# Patient Record
Sex: Female | Born: 1975 | Race: Black or African American | Hispanic: No | Marital: Married | Smoking: Never smoker
Health system: Southern US, Community
[De-identification: ages and names within clinical notes are randomized; demographics above are authoritative.]

## PROBLEM LIST (undated history)

## (undated) DIAGNOSIS — R002 Palpitations: Secondary | ICD-10-CM

---

## 2005-10-02 ENCOUNTER — Emergency Department (HOSPITAL_COMMUNITY): Admission: EM | Admit: 2005-10-02 | Discharge: 2005-10-03 | Payer: Self-pay | Admitting: Emergency Medicine

## 2005-10-12 ENCOUNTER — Inpatient Hospital Stay (HOSPITAL_COMMUNITY): Admission: AD | Admit: 2005-10-12 | Discharge: 2005-10-12 | Payer: Self-pay | Admitting: Obstetrics and Gynecology

## 2005-11-13 ENCOUNTER — Inpatient Hospital Stay (HOSPITAL_COMMUNITY): Admission: AD | Admit: 2005-11-13 | Discharge: 2005-11-13 | Payer: Self-pay | Admitting: Gynecology

## 2006-05-12 ENCOUNTER — Encounter (INDEPENDENT_AMBULATORY_CARE_PROVIDER_SITE_OTHER): Payer: Self-pay | Admitting: Specialist

## 2006-05-12 ENCOUNTER — Inpatient Hospital Stay (HOSPITAL_COMMUNITY): Admission: RE | Admit: 2006-05-12 | Discharge: 2006-05-15 | Payer: Self-pay | Admitting: Obstetrics

## 2008-08-28 ENCOUNTER — Inpatient Hospital Stay (HOSPITAL_COMMUNITY): Admission: AD | Admit: 2008-08-28 | Discharge: 2008-08-28 | Payer: Self-pay | Admitting: Obstetrics & Gynecology

## 2008-08-28 ENCOUNTER — Ambulatory Visit: Payer: Self-pay | Admitting: Obstetrics and Gynecology

## 2008-08-30 ENCOUNTER — Inpatient Hospital Stay (HOSPITAL_COMMUNITY): Admission: AD | Admit: 2008-08-30 | Discharge: 2008-08-30 | Payer: Self-pay | Admitting: Obstetrics & Gynecology

## 2008-09-04 ENCOUNTER — Inpatient Hospital Stay (HOSPITAL_COMMUNITY): Admission: AD | Admit: 2008-09-04 | Discharge: 2008-09-04 | Payer: Self-pay | Admitting: Obstetrics & Gynecology

## 2008-10-09 ENCOUNTER — Inpatient Hospital Stay (HOSPITAL_COMMUNITY): Admission: AD | Admit: 2008-10-09 | Discharge: 2008-10-09 | Payer: Self-pay | Admitting: Obstetrics & Gynecology

## 2008-10-09 ENCOUNTER — Ambulatory Visit: Payer: Self-pay | Admitting: Obstetrics and Gynecology

## 2009-01-09 ENCOUNTER — Inpatient Hospital Stay (HOSPITAL_COMMUNITY): Admission: AD | Admit: 2009-01-09 | Discharge: 2009-01-09 | Payer: Self-pay | Admitting: Obstetrics

## 2009-04-08 ENCOUNTER — Inpatient Hospital Stay (HOSPITAL_COMMUNITY): Admission: RE | Admit: 2009-04-08 | Discharge: 2009-04-11 | Payer: Self-pay | Admitting: Obstetrics

## 2009-06-23 ENCOUNTER — Emergency Department (HOSPITAL_COMMUNITY): Admission: EM | Admit: 2009-06-23 | Discharge: 2009-06-23 | Payer: Self-pay | Admitting: Emergency Medicine

## 2010-07-04 ENCOUNTER — Emergency Department (HOSPITAL_COMMUNITY)
Admission: EM | Admit: 2010-07-04 | Discharge: 2010-07-04 | Disposition: A | Discharge status: Home / Self Care | Payer: Self-pay | Attending: Emergency Medicine | Admitting: Emergency Medicine

## 2010-07-04 DIAGNOSIS — N61 Mastitis without abscess: Secondary | ICD-10-CM | POA: Insufficient documentation

## 2010-07-04 DIAGNOSIS — R5381 Other malaise: Secondary | ICD-10-CM | POA: Insufficient documentation

## 2010-07-04 DIAGNOSIS — N63 Unspecified lump in unspecified breast: Secondary | ICD-10-CM | POA: Insufficient documentation

## 2010-07-04 DIAGNOSIS — N644 Mastodynia: Secondary | ICD-10-CM | POA: Insufficient documentation

## 2010-07-04 DIAGNOSIS — R42 Dizziness and giddiness: Secondary | ICD-10-CM | POA: Insufficient documentation

## 2010-07-06 ENCOUNTER — Inpatient Hospital Stay (HOSPITAL_COMMUNITY)
Admission: AD | Admit: 2010-07-06 | Discharge: 2010-07-06 | Disposition: A | Discharge status: Home / Self Care | Payer: Self-pay | Source: Ambulatory Visit | Attending: Obstetrics and Gynecology | Admitting: Obstetrics and Gynecology

## 2010-07-06 DIAGNOSIS — N61 Mastitis without abscess: Secondary | ICD-10-CM | POA: Insufficient documentation

## 2010-08-26 LAB — CBC
Hemoglobin: 11.7 g/dL — ABNORMAL LOW (ref 12.0–15.0)
Hemoglobin: 9.6 g/dL — ABNORMAL LOW (ref 12.0–15.0)
MCHC: 32.7 g/dL (ref 30.0–36.0)
MCV: 85.1 fL (ref 78.0–100.0)
Platelets: 150 10*3/uL (ref 150–400)
RBC: 3.42 MIL/uL — ABNORMAL LOW (ref 3.87–5.11)
RDW: 16 % — ABNORMAL HIGH (ref 11.5–15.5)

## 2010-08-26 LAB — RPR: RPR Ser Ql: NONREACTIVE

## 2010-08-29 LAB — URINALYSIS, ROUTINE W REFLEX MICROSCOPIC
Bilirubin Urine: NEGATIVE
Ketones, ur: NEGATIVE mg/dL
Nitrite: NEGATIVE
pH: 6 (ref 5.0–8.0)

## 2010-08-29 LAB — URINE MICROSCOPIC-ADD ON

## 2010-09-01 LAB — URINE MICROSCOPIC-ADD ON

## 2010-09-01 LAB — URINALYSIS, ROUTINE W REFLEX MICROSCOPIC
Glucose, UA: NEGATIVE mg/dL
Ketones, ur: NEGATIVE mg/dL
Protein, ur: NEGATIVE mg/dL

## 2010-10-09 NOTE — Discharge Summary (Signed)
NAMETRYSTAN, EADS NO.:  [id number]   MEDICAL RECORD NO.:  [id number]          PATIENT TYPE:  INP   LOCATION:  9118                          FACILITY:  [facility]   PHYSICIAN:  Kathreen Cosier, M.D.DATE OF BIRTH:  1976/04/30   DATE OF ADMISSION:  05/12/2006  DATE OF DISCHARGE:  05/15/2006                               DISCHARGE SUMMARY   HOSPITAL COURSE:  The patient is a 34 year old gravida 2, para 1-0-0-1  who had a previous Cesarean section with her other baby.  She is now at  term with an [facility] of May 19, 2006 and desired a repeat Cesarean  section.  She underwent a repeat low transverse Cesarean section.  She  had a female with Apgar's of 9 and 9, weighing 7 pounds, 12 ounces.  Fluid was clear.  Postoperatively she did well.  Her hemoglobin was  10.5.  She was discharged home on the third postoperative day ambulatory  and on a regular diet.  She is discharged on Tylox for pain and ferrous  sulfate for anemia.  She is to see me in six weeks.   DISCHARGE DIAGNOSES:  Status post repeat low transverse Cesarean section  at term.           ______________________________  Kathreen Cosier, M.D.     BAM/MEDQ  D:  06/08/2006  T:  06/08/2006  Job:  [id number]

## 2010-10-09 NOTE — Op Note (Signed)
NAMECELISE, BAZAR NO.:  [id number]   MEDICAL RECORD NO.:  [id number]          PATIENT TYPE:  INP   LOCATION:  9199                          FACILITY:  [facility]   PHYSICIAN:  Kathreen Cosier, M.D.DATE OF BIRTH:  [birth year]   DATE OF PROCEDURE:  05/12/2006  DATE OF DISCHARGE:                               OPERATIVE REPORT   PREOPERATIVE DIAGNOSIS:  Previous cesarean section at term, desires  repeat.   SURGEON:  Kathreen Cosier, M.D.   ANESTHESIA:  Spinal.   DESCRIPTION OF PROCEDURE:  Patient placed on the operating table in the  supine position after spinal had been administered.  Abdomen prepped and  draped. Bladder emptied with Foley catheter.  A suprapubic incision made  through the old scar, carried down to the rectus fascia.  Fascia cleaned  and incised the length of the incision.  Rectus muscles retracted  laterally. Peritoneum incised longitudinally.  Transverse incision made  in the visceral peritoneum above the bladder.  Bladder mobilized  inferiorly.  Transverse lower uterine incision made.  Fluid clear.  Patient delivered from the LOA position of a female, Apgars 9 and 9,  weighing 7 pounds 12 ounces. Team was in attendance.  The placenta was  posterior and removed manually.  Uterine cavity cleaned with dry laps.  Uterine incision closed in one layer with continuous suture of #1  chromic.  Hemostasis satisfactory.  Bladder flap was reattached with 2-0  chromic.  Uterus well contracted, tubes and ovaries normal.  Abdomen  closed in layers, peritoneum with continuous suture of 0 chromic, fascia  with continuous suture of 0 Dexon and skin closed with subcuticular  stitch of 4-0 Monocryl.   BLOOD LOSS:  600 mL.   Patient tolerated the procedure well.           ______________________________  Kathreen Cosier, M.D.     BAM/MEDQ  D:  05/12/2006  T:  05/12/2006  Job:  [id number]

## 2011-03-01 ENCOUNTER — Emergency Department (HOSPITAL_COMMUNITY)
Admission: EM | Admit: 2011-03-01 | Discharge: 2011-03-01 | Disposition: A | Discharge status: Home / Self Care | Payer: Self-pay | Attending: Emergency Medicine | Admitting: Emergency Medicine

## 2011-03-01 ENCOUNTER — Emergency Department (HOSPITAL_COMMUNITY): Payer: Self-pay

## 2011-03-01 DIAGNOSIS — M79609 Pain in unspecified limb: Secondary | ICD-10-CM | POA: Insufficient documentation

## 2011-03-01 DIAGNOSIS — M722 Plantar fascial fibromatosis: Secondary | ICD-10-CM | POA: Insufficient documentation

## 2012-09-05 ENCOUNTER — Emergency Department (HOSPITAL_COMMUNITY)
Admission: EM | Admit: 2012-09-05 | Discharge: 2012-09-05 | Disposition: A | Discharge status: Home / Self Care | Payer: Self-pay | Attending: Emergency Medicine | Admitting: Emergency Medicine

## 2012-09-05 ENCOUNTER — Encounter (HOSPITAL_COMMUNITY): Payer: Self-pay | Admitting: *Deleted

## 2012-09-05 DIAGNOSIS — J029 Acute pharyngitis, unspecified: Secondary | ICD-10-CM | POA: Insufficient documentation

## 2012-09-05 DIAGNOSIS — J309 Allergic rhinitis, unspecified: Secondary | ICD-10-CM

## 2012-09-05 DIAGNOSIS — J069 Acute upper respiratory infection, unspecified: Secondary | ICD-10-CM | POA: Insufficient documentation

## 2012-09-05 DIAGNOSIS — R0982 Postnasal drip: Secondary | ICD-10-CM | POA: Insufficient documentation

## 2012-09-05 DIAGNOSIS — J302 Other seasonal allergic rhinitis: Secondary | ICD-10-CM

## 2012-09-05 DIAGNOSIS — J3489 Other specified disorders of nose and nasal sinuses: Secondary | ICD-10-CM | POA: Insufficient documentation

## 2012-09-05 DIAGNOSIS — H5789 Other specified disorders of eye and adnexa: Secondary | ICD-10-CM | POA: Insufficient documentation

## 2012-09-05 DIAGNOSIS — R6889 Other general symptoms and signs: Secondary | ICD-10-CM | POA: Insufficient documentation

## 2012-09-05 MED ORDER — LORATADINE 10 MG PO TABS: 10.0000 mg | ORAL_TABLET | Freq: Every day | ORAL | Status: DC

## 2012-09-05 MED ORDER — FLUTICASONE PROPIONATE 50 MCG/ACT NA SUSP: 2.0000 | Freq: Every day | NASAL | Status: DC

## 2012-09-05 NOTE — ED Provider Notes (Signed)
History     CSN: [id number]  Arrival date & time 09/05/12  1644   First MD Initiated Contact with Patient 09/05/12 1822      Chief Complaint  Patient presents with  . URI    (Consider location/radiation/quality/duration/timing/severity/associated sxs/prior treatment) HPI Comments: 37 year old female with no significant past medical history presents to the emergency department complaining of itchy eyes, itchy throat and runny nose x2 weeks. Patient states she has seasonal allergies and feels like they're acting up. She has tried taking his meds without relief. Despite triage summary, patient denies cough. Rhinorrhea is clear. Denies fever, chills, shortness of breath, nausea or vomiting. Her daughter has similar symptoms. Patient states she has been outside more taking her kids to and from places.  Patient is a 37 y.o. female presenting with URI. The history is provided by the patient.  URI Presenting symptoms: rhinorrhea and sore throat   Presenting symptoms: no cough and no fever   Associated symptoms: no headaches     History reviewed. No pertinent past medical history.  Past Surgical History  Procedure Laterality Date  . Cesarean section      No family history on file.  History  Substance Use Topics  . Smoking status: Never Smoker   . Smokeless tobacco: Not on file  . Alcohol Use: No    OB History   Grav Para Term Preterm Abortions TAB SAB Ect Mult Living                  Review of Systems  Constitutional: Negative for fever and chills.  HENT: Positive for sore throat and rhinorrhea.   Eyes: Positive for discharge (clear, watery) and itching. Negative for pain and redness.  Respiratory: Negative for cough and shortness of breath.   Cardiovascular: Negative for chest pain.  Skin: Negative for rash.  Neurological: Negative for headaches.  All other systems reviewed and are negative.    Allergies  Review of patient's allergies indicates no known  allergies.  Home Medications   Current Outpatient Rx  Name  Route  Sig  Dispense  Refill  . fluticasone (FLONASE) 50 MCG/ACT nasal spray   Nasal   Place 2 sprays into the nose daily.   16 g   2   . loratadine (CLARITIN) 10 MG tablet   Oral   Take 1 tablet (10 mg total) by mouth daily.   5 tablet   0     BP 124/81  Pulse 90  Temp(Src) 98.2 F (36.8 C) (Oral)  Resp 16  SpO2 98%  LMP 08/21/2012  Physical Exam  Nursing note and vitals reviewed. Constitutional: She is oriented to person, place, and time. She appears well-developed and well-nourished. No distress.  HENT:  Head: Normocephalic and atraumatic.  Right Ear: Tympanic membrane and ear canal normal.  Nose: Mucosal edema present. No rhinorrhea. Right sinus exhibits no maxillary sinus tenderness and no frontal sinus tenderness. Left sinus exhibits no maxillary sinus tenderness and no frontal sinus tenderness.  Mouth/Throat: Uvula is midline and mucous membranes are normal. Posterior oropharyngeal erythema present. No oropharyngeal exudate or posterior oropharyngeal edema.  Clear post-nasal drip present.  Eyes: Conjunctivae and EOM are normal. Pupils are equal, round, and reactive to light. Right eye exhibits no discharge. Left eye exhibits no discharge.  Neck: Normal range of motion. Neck supple.  Cardiovascular: Normal rate, regular rhythm, normal heart sounds and intact distal pulses.   Pulmonary/Chest: Effort normal and breath sounds normal. No respiratory distress. She has no  wheezes. She has no rales.  Musculoskeletal: Normal range of motion. She exhibits no edema.  Lymphadenopathy:    She has no cervical adenopathy.  Neurological: She is alert and oriented to person, place, and time.  Skin: Skin is warm and dry. No rash noted. She is not diaphoretic.  Psychiatric: She has a normal mood and affect. Her behavior is normal.    ED Course  Procedures (including critical care time)  Labs Reviewed - No data to  display No results found.   1. Seasonal allergies   2. Allergic rhinitis       MDM  37 y/o female with seasonal allergies. Mucosal edema with clear post nasal drip present on exam. Vitals stable, afebrile, NAD. Rx flonase for mucosal edema and claritin daily. Advised OTC eye drops. Resource guide given for PCP follow up.        Trevor Mace, PA-C 09/05/12 [phone]

## 2012-09-05 NOTE — ED Notes (Signed)
Pt states that she is not having pain but her throat, nose, eyes, and ears itch.

## 2012-09-05 NOTE — ED Notes (Signed)
Pt states cough, runny nose, sore throat and itchy eyes x 2 weeks.  She states every spring she has the same problem.  She took mucinex thinking it would help and it hasn't, that 's why she came today.  Her daughter is with her and she would like to take her to peds.

## 2012-09-07 NOTE — ED Provider Notes (Signed)
Medical screening examination/treatment/procedure(s) were performed by non-physician practitioner and as supervising physician I was immediately available for consultation/collaboration.   Saajan Willmon M Erich Kochan, DO 09/07/12 1444 

## 2012-09-16 IMAGING — CR DG FOOT COMPLETE 3+V*L*
3 series · 3 of 3 positions shown · non-contrast
Comparison: None.

CLINICAL DATA: Left foot pain.

LEFT FOOT - COMPLETE 3+ VIEW

[x foot ap left]
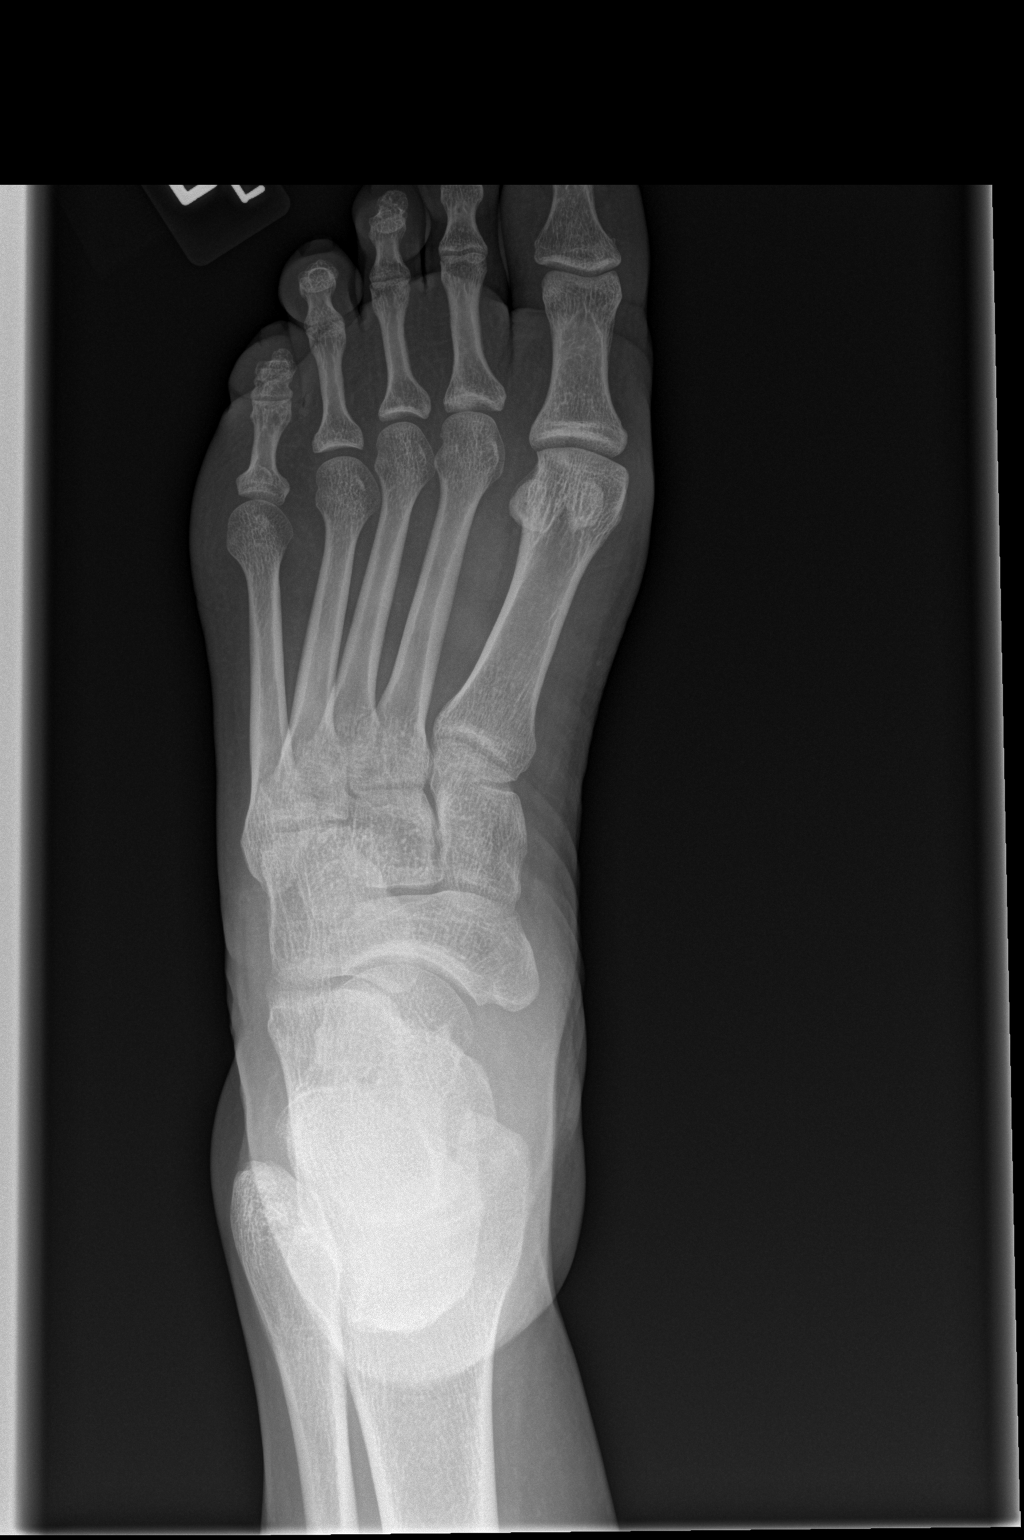

[x foot obl left]
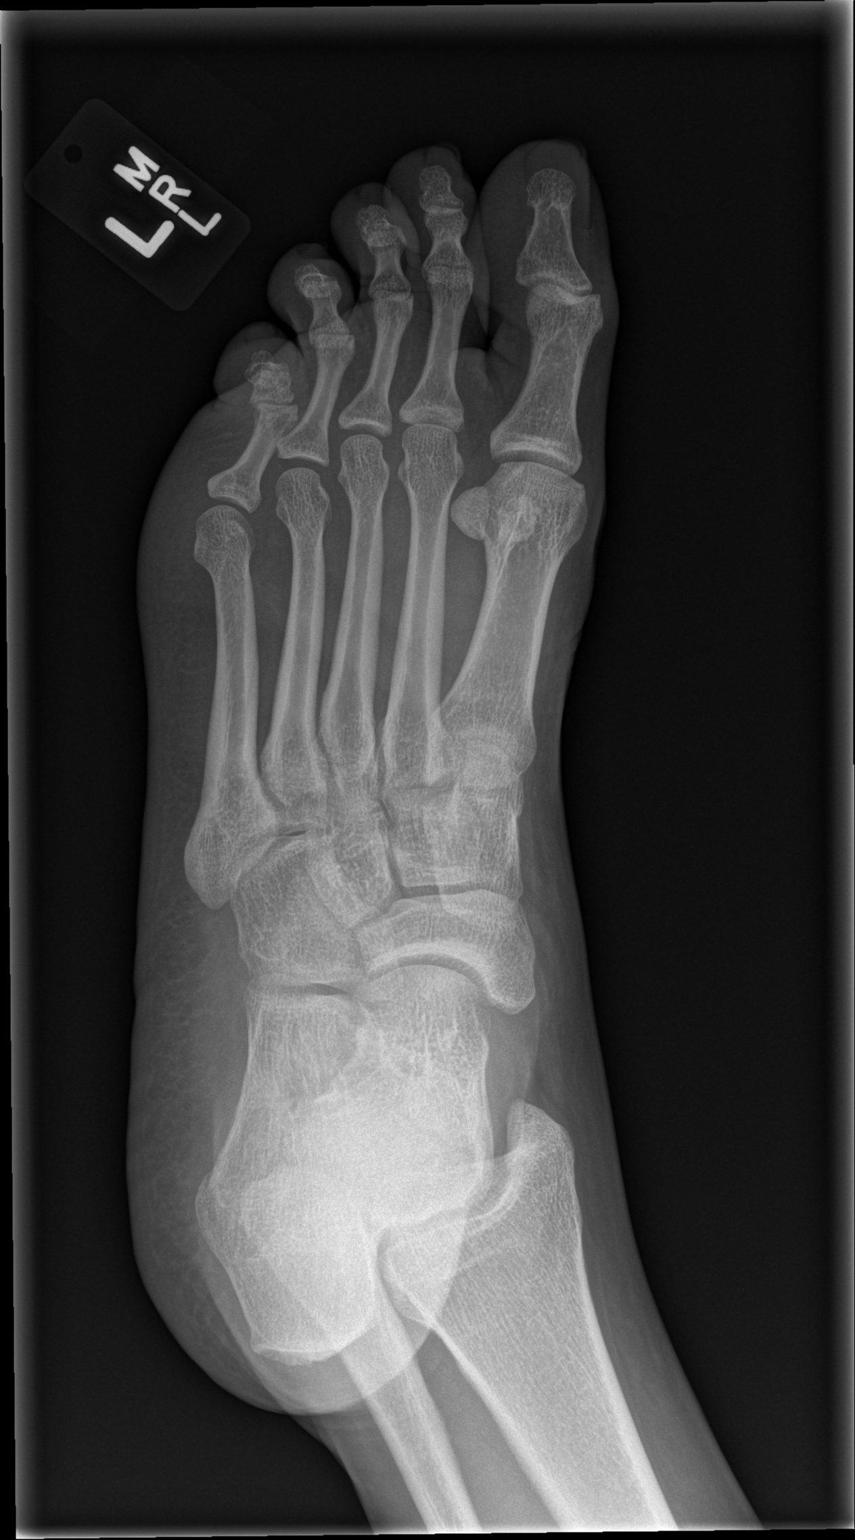

[x foot lat left]
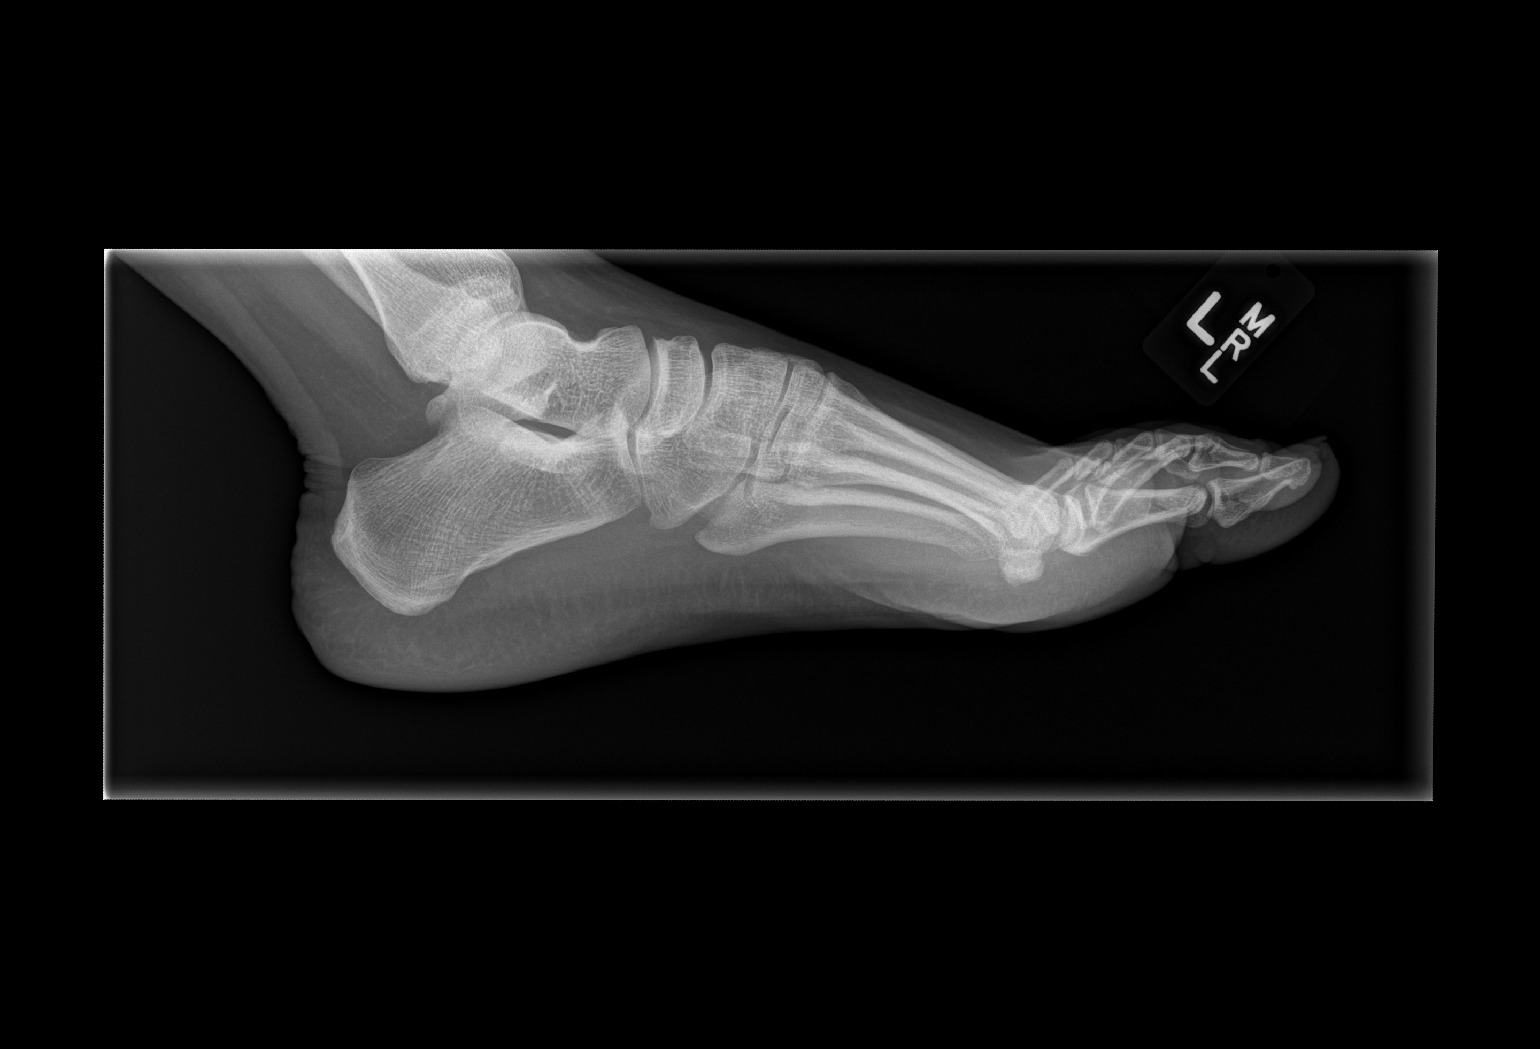

[3 of 3 positions shown; findings below may reference images not displayed]

FINDINGS: There is no evidence of fracture or dislocation.  There
is no evidence of arthropathy or other focal bone abnormality.
Soft tissues are unremarkable.
IMPRESSION: Negative.

## 2013-06-22 ENCOUNTER — Encounter (HOSPITAL_COMMUNITY): Payer: Self-pay | Admitting: *Deleted

## 2013-06-22 ENCOUNTER — Inpatient Hospital Stay (HOSPITAL_COMMUNITY)
Admission: AD | Admit: 2013-06-22 | Discharge: 2013-06-22 | Disposition: A | Discharge status: Home / Self Care | Payer: 59 | Source: Ambulatory Visit | Attending: Obstetrics & Gynecology | Admitting: Obstetrics & Gynecology

## 2013-06-22 DIAGNOSIS — R21 Rash and other nonspecific skin eruption: Secondary | ICD-10-CM | POA: Insufficient documentation

## 2013-06-22 DIAGNOSIS — R350 Frequency of micturition: Secondary | ICD-10-CM | POA: Insufficient documentation

## 2013-06-22 DIAGNOSIS — R300 Dysuria: Secondary | ICD-10-CM | POA: Insufficient documentation

## 2013-06-22 DIAGNOSIS — B372 Candidiasis of skin and nail: Secondary | ICD-10-CM

## 2013-06-22 HISTORY — DX: Palpitations: R00.2

## 2013-06-22 LAB — URINE MICROSCOPIC-ADD ON

## 2013-06-22 LAB — URINALYSIS, ROUTINE W REFLEX MICROSCOPIC
BILIRUBIN URINE: NEGATIVE
GLUCOSE, UA: NEGATIVE mg/dL
KETONES UR: NEGATIVE mg/dL
Nitrite: NEGATIVE
PH: 5 (ref 5.0–8.0)
PROTEIN: NEGATIVE mg/dL
Specific Gravity, Urine: >1.03 — ABNORMAL HIGH (ref 1.005–1.030)
Urobilinogen, UA: 0.2 mg/dL (ref 0.0–1.0)

## 2013-06-22 MED ORDER — FLUCONAZOLE 150 MG PO TABS
150.0000 mg | ORAL_TABLET | Freq: Once | ORAL | Status: AC
  Administered 2013-06-22: 150 mg via ORAL
  Filled 2013-06-22: qty 1

## 2013-06-22 MED ORDER — CLOTRIMAZOLE 1 % EX CREA
TOPICAL_CREAM | CUTANEOUS | Status: AC
Start: 2013-06-22 — End: ?

## 2013-06-22 NOTE — Discharge Instructions (Signed)
Candida Infection, Adult A candida infection (also called yeast, fungus and Monilia infection) is an overgrowth of yeast that can occur anywhere on the body. A yeast infection commonly occurs in warm, moist body areas. Usually, the infection remains localized but can spread to become a systemic infection. A yeast infection may be a sign of a more severe disease such as diabetes, leukemia, or AIDS. A yeast infection can occur in both men and women. In women, Candida vaginitis is a vaginal infection. It is one of the most common causes of vaginitis. Men usually do not have symptoms or know they have an infection until other problems develop. Men may find out they have a yeast infection because their sex partner has a yeast infection. Uncircumcised men are more likely to get a yeast infection than circumcised men. This is because the uncircumcised glans is not exposed to air and does not remain as dry as that of a circumcised glans. Older adults may develop yeast infections around dentures. CAUSES  Women  Antibiotics.  Steroid medication taken for a long time.  Being overweight (obese).  Diabetes.  Poor immune condition.  Certain serious medical conditions.  Immune suppressive medications for organ transplant patients.  Chemotherapy.  Pregnancy.  Menstration.  Stress and fatigue.  Intravenous drug use.  Oral contraceptives.  Wearing tight-fitting clothes in the crotch area.  Catching it from a sex partner who has a yeast infection.  Spermicide.  Intravenous, urinary, or other catheters. Men  Catching it from a sex partner who has a yeast infection.  Having oral or anal sex with a person who has the infection.  Spermicide.  Diabetes.  Antibiotics.  Poor immune system.  Medications that suppress the immune system.  Intravenous drug use.  Intravenous, urinary, or other catheters. SYMPTOMS  Women  Thick, white vaginal discharge.  Vaginal itching.  Redness and  swelling in and around the vagina.  Irritation of the lips of the vagina and perineum.  Blisters on the vaginal lips and perineum.  Painful sexual intercourse.  Low blood sugar (hypoglycemia).  Painful urination.  Bladder infections.  Intestinal problems such as constipation, indigestion, bad breath, bloating, increase in gas, diarrhea, or loose stools. Men  Men may develop intestinal problems such as constipation, indigestion, bad breath, bloating, increase in gas, diarrhea, or loose stools.  Dry, cracked skin on the penis with itching or discomfort.  Jock itch.  Dry, flaky skin.  Athlete's foot.  Hypoglycemia. DIAGNOSIS  Women  A history and an exam are performed.  The discharge may be examined under a microscope.  A culture may be taken of the discharge. Men  A history and an exam are performed.  Any discharge from the penis or areas of cracked skin will be looked at under the microscope and cultured.  Stool samples may be cultured. TREATMENT  Women  Vaginal antifungal suppositories and creams.  Medicated creams to decrease irritation and itching on the outside of the vagina.  Warm compresses to the perineal area to decrease swelling and discomfort.  Oral antifungal medications.  Medicated vaginal suppositories or cream for repeated or recurrent infections.  Wash and dry the irritation areas before applying the cream.  Eating yogurt with lactobacillus may help with prevention and treatment.  Sometimes painting the vagina with gentian violet solution may help if creams and suppositories do not work. Men  Antifungal creams and oral antifungal medications.  Sometimes treatment must continue for 30 days after the symptoms go away to prevent recurrence. HOME CARE   INSTRUCTIONS  Women  Use cotton underwear and avoid tight-fitting clothing.  Avoid colored, scented toilet paper and deodorant tampons or pads.  Do not douche.  Keep your diabetes  under control.  Finish all the prescribed medications.  Keep your skin clean and dry.  Consume milk or yogurt with lactobacillus active culture regularly. If you get frequent yeast infections and think that is what the infection is, there are over-the-counter medications that you can get. If the infection does not show healing in 3 days, talk to your caregiver.  Tell your sex partner you have a yeast infection. Your partner may need treatment also, especially if your infection does not clear up or recurs. Men  Keep your skin clean and dry.  Keep your diabetes under control.  Finish all prescribed medications.  Tell your sex partner that you have a yeast infection so they can be treated if necessary. SEEK MEDICAL CARE IF:   Your symptoms do not clear up or worsen in one week after treatment.  You have an oral temperature above 102 F (38.9 C).  You have trouble swallowing or eating for a prolonged time.  You develop blisters on and around your vagina.  You develop vaginal bleeding and it is not your menstrual period.  You develop abdominal pain.  You develop intestinal problems as mentioned above.  You get weak or lightheaded.  You have painful or increased urination.  You have pain during sexual intercourse. MAKE SURE YOU:   Understand these instructions.  Will watch your condition.  Will get help right away if you are not doing well or get worse. Document Released: 06/17/2004 Document Revised: 08/02/2011 Document Reviewed: 09/29/2009 [employer] Patient Information 2014 [employer].  

## 2013-06-22 NOTE — MAU Note (Signed)
C/o rash on both breast for past 4 days; c/o intermittent R breast lump( that is not present now); c/o recurrent boils on vagina for past year; has a boil present but it has "popped";

## 2013-06-22 NOTE — MAU Provider Note (Signed)
History     CSN: [id number]  Arrival date and time: 06/22/13 1020   None     Chief Complaint  Patient presents with  . Rash  . Breast Mass  . Recurrent Skin Infections   HPI Anna Saunders is 38 y.o. [id number] presents with a pruitic rash on her breasts and feels like something is in the right breast.  The "fullness" has been in the right breast for over a year.  Rash began 4 days ago.  She also had bumps that pop-clear fluid on her vaginal area that have now resolved.  She also has urinary frequency and dysuria.  She denies fever and chills.  LMP was last week.  Is not using anything for contraception.  She would like a pregnancy.  Dr. Gaynell Saunders is her MD    Past Medical History  Diagnosis Date  . Heart palpitations     Past Surgical History  Procedure Laterality Date  . Cesarean section      Family History  Problem Relation Age of Onset  . Hypertension Mother   . Diabetes Father   . Hypertension Maternal Grandmother   . Diabetes Paternal Grandfather     History  Substance Use Topics  . Smoking status: Never Smoker   . Smokeless tobacco: Not on file  . Alcohol Use: No    Allergies: No Known Allergies  Prescriptions prior to admission  Medication Sig Dispense Refill  . Cholecalciferol (VITAMIN D PO) Take 1 tablet by mouth daily.      . naproxen sodium (ANAPROX) 220 MG tablet Take 220 mg by mouth daily as needed (for pain).        Review of Systems  Constitutional: Negative for fever and chills.  Gastrointestinal: Negative for nausea, vomiting and abdominal pain.  Genitourinary: Negative for dysuria and frequency.       BREAST--rash X 4 days on her breast.  1 yr of "feeling something" in her right breast.  Neurological: Negative for headaches.   Physical Exam   Blood pressure 120/76, pulse 68, temperature 98.9 F (37.2 C), temperature source Oral, resp. rate 18, weight 240 lb (108.863 kg).  Physical Exam  Constitutional: She is oriented to person,  place, and time. She appears well-developed and well-nourished. No distress.  HENT:  Head: Normocephalic.  Neck: Normal range of motion.  Cardiovascular: Normal rate.   Respiratory: Effort normal. Right breast exhibits skin change (Several areas of dry, rough circular patches without redness, tenderness, blistering or oozing.  Several on each breast.  Breast exam is negative for dominant masses or nipple discharge bialterally.  Mild FCB changes in the UOQ. ). Right breast exhibits no inverted nipple, no mass and no tenderness. Left breast exhibits skin change. Left breast exhibits no inverted nipple, no mass, no nipple discharge and no tenderness. Breasts are symmetrical.  GI: Soft. She exhibits no distension and no mass. There is no tenderness. There is no rebound and no guarding.  Genitourinary: There is no rash, tenderness or lesion on the right labia. There is no rash, tenderness or lesion on the left labia. Uterus is not enlarged and not tender. Cervix exhibits no motion tenderness, no discharge and no friability. Right adnexum displays no mass, no tenderness and no fullness. Left adnexum displays no mass, no tenderness and no fullness. There is bleeding (small amount of menstrual bleeding) around the vagina. No erythema or tenderness around the vagina. No foreign body around the vagina. No signs of injury around the vagina. No vaginal  discharge found.  Neurological: She is alert and oriented to person, place, and time.  Skin: Skin is warm and dry.  Psychiatric: She has a normal mood and affect. Her behavior is normal.   Results for orders placed during the hospital encounter of 06/22/13 (from the past 24 hour(s))  URINALYSIS, ROUTINE W REFLEX MICROSCOPIC     Status: Abnormal   Collection Time    06/22/13  2:35 PM      Result Value Range   Color, Urine YELLOW  YELLOW   APPearance CLEAR  CLEAR   Specific Gravity, Urine >1.030 (*) 1.005 - 1.030   pH 5.0  5.0 - 8.0   Glucose, UA NEGATIVE   NEGATIVE mg/dL   Hgb urine dipstick LARGE (*) NEGATIVE   Bilirubin Urine NEGATIVE  NEGATIVE   Ketones, ur NEGATIVE  NEGATIVE mg/dL   Protein, ur NEGATIVE  NEGATIVE mg/dL   Urobilinogen, UA 0.2  0.0 - 1.0 mg/dL   Nitrite NEGATIVE  NEGATIVE   Leukocytes, UA TRACE (*) NEGATIVE  URINE MICROSCOPIC-ADD ON     Status: Abnormal   Collection Time    06/22/13  2:35 PM      Result Value Range   Squamous Epithelial / LPF FEW (*) RARE   WBC, UA 3-6  <3 WBC/hpf   RBC / HPF 3-6  <3 RBC/hpf   Bacteria, UA FEW (*) RARE      MAU Course  Procedures DIflucan 150mg  po given in MAU MDM Discussed labs and physical findings with the patient.   She will follow up with Dr. Gaynell FaceMarshall is sxs worsen  Assessment and Plan  A:  Rash on breasts-appears to be fungal      Negative UA  P:  Antifungal cream to Pharmacy       Follow up with Dr. Gaynell FaceMarshall if sxs persist for re-evaluation      Screening mammogram at age 38.    Anna Saunders,EVE M 06/22/2013, 1:33 PM

## 2013-06-24 LAB — URINE CULTURE: Colony Count: >=100000

## 2013-06-25 ENCOUNTER — Other Ambulatory Visit: Payer: Self-pay | Admitting: Obstetrics & Gynecology

## 2013-06-25 MED ORDER — CIPROFLOXACIN HCL 500 MG PO TABS: 500.0000 mg | ORAL_TABLET | Freq: Two times a day (BID) | ORAL | Status: AC

## 2013-06-25 NOTE — Progress Notes (Signed)
E coli UTI sensitive to cipro.  Rx called in.  Rn to call pt.

## 2013-06-26 NOTE — Progress Notes (Signed)
Called pt and informed her of +UTI requiring antibiotic treatment. She was advised that her Rx has been sent to her pharmacy and should be ready for pick up.  Pt voiced understanding.

## 2014-03-25 ENCOUNTER — Encounter (HOSPITAL_COMMUNITY): Payer: Self-pay | Admitting: *Deleted

## 2019-05-29 ENCOUNTER — Encounter (HOSPITAL_COMMUNITY): Payer: Self-pay | Admitting: *Deleted

## 2019-05-29 ENCOUNTER — Emergency Department (HOSPITAL_COMMUNITY)
Admission: EM | Admit: 2019-05-29 | Discharge: 2019-05-29 | Disposition: A | Discharge status: Home / Self Care | Payer: 59 | Attending: Emergency Medicine | Admitting: Emergency Medicine

## 2019-05-29 ENCOUNTER — Other Ambulatory Visit: Payer: Self-pay

## 2019-05-29 DIAGNOSIS — Z5321 Procedure and treatment not carried out due to patient leaving prior to being seen by health care provider: Secondary | ICD-10-CM | POA: Insufficient documentation

## 2019-05-29 DIAGNOSIS — R0789 Other chest pain: Secondary | ICD-10-CM | POA: Insufficient documentation

## 2019-05-29 DIAGNOSIS — M79605 Pain in left leg: Secondary | ICD-10-CM | POA: Insufficient documentation

## 2019-05-29 NOTE — ED Triage Notes (Signed)
Pain if left leg for months, last night left ribs pain.

## 2020-06-06 ENCOUNTER — Emergency Department (HOSPITAL_COMMUNITY)
Admission: EM | Admit: 2020-06-06 | Discharge: 2020-06-07 | Disposition: A | Discharge status: Home / Self Care | Payer: HRSA Program | Attending: Emergency Medicine | Admitting: Emergency Medicine

## 2020-06-06 ENCOUNTER — Other Ambulatory Visit: Payer: Self-pay

## 2020-06-06 DIAGNOSIS — U071 COVID-19: Secondary | ICD-10-CM | POA: Insufficient documentation

## 2020-06-06 DIAGNOSIS — Z20822 Contact with and (suspected) exposure to covid-19: Secondary | ICD-10-CM

## 2020-06-06 DIAGNOSIS — R519 Headache, unspecified: Secondary | ICD-10-CM | POA: Diagnosis present

## 2020-06-07 ENCOUNTER — Encounter (HOSPITAL_COMMUNITY): Payer: Self-pay | Admitting: Emergency Medicine

## 2020-06-07 ENCOUNTER — Emergency Department (HOSPITAL_COMMUNITY): Payer: HRSA Program

## 2020-06-07 LAB — SARS CORONAVIRUS 2 (TAT 6-24 HRS): SARS Coronavirus 2: POSITIVE — AB

## 2020-06-07 MED ORDER — ONDANSETRON 4 MG PO TBDP
4.0000 mg | ORAL_TABLET | Freq: Once | ORAL | Status: AC
  Administered 2020-06-07: 4 mg via ORAL
  Filled 2020-06-07: qty 1

## 2020-06-07 MED ORDER — ONDANSETRON HCL 4 MG PO TABS: 4.0000 mg | ORAL_TABLET | Freq: Three times a day (TID) | ORAL | 0 refills | Status: AC | PRN

## 2020-06-07 NOTE — Discharge Instructions (Addendum)
Your symptoms are concerning for COVID infection.   Check result through MyChart.  If positive, follows instruction below.  Recommendations for at home COVID-19 symptoms management:  Please continue isolation at home. Call [phone] to see whether you might be eligible for therapeutic antibody infusions (leave your name and they will call you back).  If have acute worsening of symptoms please go to ER/urgent care for further evaluation. Check pulse oximetry and if below 90-92% please go to ER. The following supplements MAY help:  Vitamin C 500mg  twice a day and Quercetin 250-500 mg twice a day Vitamin D3 2000 - 4000 u/day B Complex vitamins Zinc 75-100 mg/day Melatonin 6-10 mg at night (the optimal dose is unknown) Aspirin 81mg /day (if no history of bleeding issues)

## 2020-06-07 NOTE — ED Triage Notes (Signed)
Patient states that Monday her and her daughter began having COVID-like symptoms with subjective fever, nausea but no vomiting, body aches and chest pain. Patient denies dyspnea.

## 2020-06-07 NOTE — ED Provider Notes (Signed)
[facility]-EMERGENCY DEPT Provider Note   CSN: [id number] Arrival date & time: 06/06/20  2336     History Chief Complaint  Patient presents with  . Generalized Body Aches  . Chest Pain  . Nausea    Anna Saunders is a 45 y.o. female.  The history is provided by the patient. No language interpreter was used.  Chest Pain    46 year old female without tobacco use or  alcohol use with history of hypertension presenting accompanied by daughter with COVID-like symptoms.  Patient report for the past 4 days she has had symptoms including headache, burning sensation in her nostril, loss of taste and smell, difficulty eating and drinking with minimal appetite, feeling weak, dry cough and mild chest discomfort.  Also endorses fever chills and sweats.  Her daughter is having similar symptoms as well.  She has been taking over-the-counter medication but symptoms is getting progressively worse.  No significant shortness of breath.  She has not been vaccinated for COVID-19.  She denies any dysuria.  Past Medical History:  Diagnosis Date  . Heart palpitations     There are no problems to display for this patient.   Past Surgical History:  Procedure Laterality Date  . CESAREAN SECTION       OB History    Gravida  4   Para  3   Term  3   Preterm      AB  1   Living  3     SAB  1   IAB      Ectopic      Multiple      Live Births              Family History  Problem Relation Age of Onset  . Hypertension Mother   . Diabetes Father   . Hypertension Maternal Grandmother   . Diabetes Paternal Grandfather     Social History   Tobacco Use  . Smoking status: Never Smoker  . Smokeless tobacco: Never Used  Substance Use Topics  . Alcohol use: No  . Drug use: No    Home Medications Prior to Admission medications   Medication Sig Start Date End Date Taking? Authorizing Provider  Cholecalciferol (VITAMIN D PO) Take 1 tablet by mouth  daily.    [provider]  ciprofloxacin (CIPRO) 500 MG tablet Take 1 tablet (500 mg total) by mouth 2 (two) times daily. 06/25/13   Lesly Dukes, MD  clotrimazole (LOTRIMIN) 1 % cream Apply to affected area 2 times daily 06/22/13   Key, Verita Schneiders, NP  naproxen sodium (ANAPROX) 220 MG tablet Take 220 mg by mouth daily as needed (for pain).    [provider]    Allergies    Patient has no known allergies.  Review of Systems   Review of Systems  Cardiovascular: Positive for chest pain.  All other systems reviewed and are negative.   Physical Exam Updated Vital Signs BP 133/82 (BP Location: Left Arm)   Pulse 94   Temp 99.9 F (37.7 C) (Oral)   Resp 18   SpO2 96%   Physical Exam Vitals and nursing note reviewed.  Constitutional:      General: She is not in acute distress.    Appearance: She is well-developed and well-nourished. She is obese.  HENT:     Head: Atraumatic.  Eyes:     Conjunctiva/sclera: Conjunctivae normal.  Cardiovascular:     Rate and Rhythm: Normal rate and  regular rhythm.     Heart sounds: Normal heart sounds.  Pulmonary:     Effort: Pulmonary effort is normal.     Breath sounds: Normal breath sounds. No wheezing, rhonchi or rales.  Abdominal:     Palpations: Abdomen is soft.     Tenderness: There is no abdominal tenderness.  Musculoskeletal:     Cervical back: Neck supple.  Skin:    Findings: No rash.  Neurological:     Mental Status: She is alert. Mental status is at baseline.  Psychiatric:        Mood and Affect: Mood and affect and mood normal.     ED Results / Procedures / Treatments   Labs (all labs ordered are listed, but only abnormal results are displayed) Labs Reviewed  SARS CORONAVIRUS 2 (TAT 6-24 HRS)    EKG EKG Interpretation  Date/Time:  Saturday June 07 2020 00:08:25 EST Ventricular Rate:  86 PR Interval:    QRS Duration: 90 QT Interval:  353 QTC Calculation: 423 R Axis:   -53 Text  Interpretation: Sinus rhythm Left anterior fascicular block Abnormal R-wave progression, late transition No old tracing to compare Confirmed by Devoria Albe ([id number]) on 06/07/2020 12:32:35 AM   ED ECG REPORT   Date: 06/07/2020  Rate: 86  Rhythm: normal sinus rhythm  QRS Axis: left  Intervals: normal  ST/T Wave abnormalities: abnormal R-wave progression, late transition  Conduction Disutrbances:left anterior fascicular block  Narrative Interpretation:   Old EKG Reviewed: unchanged  I have personally reviewed the EKG tracing and agree with the computerized printout as noted.   Radiology DG Chest Portable 1 View  Result Date: 06/07/2020 CLINICAL DATA:  Chest pain EXAM: PORTABLE CHEST 1 VIEW COMPARISON:  None. FINDINGS: The heart size and mediastinal contours are within normal limits. Hazy airspace opacities seen at both lung bases. No large airspace consolidation or pleural effusion. The visualized skeletal structures are unremarkable. IMPRESSION: Hazy airspace opacity at both lung bases which could be due to early infectious etiology or atelectasis. Electronically Signed   By: Jonna Clark M.D.   On: 06/07/2020 00:22    Procedures Procedures (including critical care time)  Medications Ordered in ED Medications  ondansetron (ZOFRAN-ODT) disintegrating tablet 4 mg (4 mg Oral Given 06/07/20 0041)    ED Course  I have reviewed the triage vital signs and the nursing notes.  Pertinent labs & imaging results that were available during my care of the patient were reviewed by me and considered in my medical decision making (see chart for details).    MDM Rules/Calculators/A&P                          BP 133/82 (BP Location: Left Arm)   Pulse 94   Temp 99.9 F (37.7 C) (Oral)   Resp 18   SpO2 96%   Final Clinical Impression(s) / ED Diagnoses Final diagnoses:  Suspected COVID-19 virus infection    Rx / DC Orders ED Discharge Orders    None      12:43 AM Patient who has not  been vaccinated for COVID-19 presenting with COVID symptoms.  Her chest x-ray did demonstrate hazy airspace opacity in both lungs base which could be due to early infectious etiology or atelectasis.  She is not hypoxic or hypotensive.  COVID-19 test is currently pending.  She can follow-up results through MyChart but patient otherwise stable for discharge.  Daughter who is also here is here with similar  symptoms.  Anna Saunders was evaluated in Emergency Department on 06/07/2020 for the symptoms described in the history of present illness. She was evaluated in the context of the global COVID-19 pandemic, which necessitated consideration that the patient might be at risk for infection with the SARS-CoV-2 virus that causes COVID-19. Institutional protocols and algorithms that pertain to the evaluation of patients at risk for COVID-19 are in a state of rapid change based on information released by regulatory bodies including the CDC and federal and state organizations. These policies and algorithms were followed during the patient's care in the ED.    Fayrene Helper, PA-C 06/07/20 3235    Devoria Albe, MD 06/07/20 [phone]

## 2021-02-27 ENCOUNTER — Emergency Department (HOSPITAL_COMMUNITY): Payer: Self-pay

## 2021-02-27 ENCOUNTER — Encounter (HOSPITAL_COMMUNITY): Payer: Self-pay | Admitting: Emergency Medicine

## 2021-02-27 ENCOUNTER — Other Ambulatory Visit: Payer: Self-pay

## 2021-02-27 ENCOUNTER — Emergency Department (HOSPITAL_COMMUNITY)
Admission: EM | Admit: 2021-02-27 | Discharge: 2021-02-28 | Disposition: A | Discharge status: Home / Self Care | Payer: Self-pay | Attending: Emergency Medicine | Admitting: Emergency Medicine

## 2021-02-27 DIAGNOSIS — R1010 Upper abdominal pain, unspecified: Secondary | ICD-10-CM | POA: Insufficient documentation

## 2021-02-27 DIAGNOSIS — R0789 Other chest pain: Secondary | ICD-10-CM | POA: Insufficient documentation

## 2021-02-27 DIAGNOSIS — R079 Chest pain, unspecified: Secondary | ICD-10-CM

## 2021-02-27 DIAGNOSIS — R109 Unspecified abdominal pain: Secondary | ICD-10-CM

## 2021-02-27 LAB — BASIC METABOLIC PANEL
Anion gap: 8 (ref 5–15)
BUN: 16 mg/dL (ref 6–20)
CO2: 26 mmol/L (ref 22–32)
Calcium: 9.3 mg/dL (ref 8.9–10.3)
Chloride: 108 mmol/L (ref 98–111)
Creatinine, Ser: 0.97 mg/dL (ref 0.44–1.00)
GFR, Estimated: >60 mL/min (ref >60)
Glucose, Bld: 130 mg/dL — ABNORMAL HIGH (ref 70–99)
Potassium: 3.8 mmol/L (ref 3.5–5.1)
Sodium: 142 mmol/L (ref 135–145)

## 2021-02-27 LAB — CBC
HCT: 41.2 % (ref 36.0–46.0)
Hemoglobin: 13 g/dL (ref 12.0–15.0)
MCH: 27 pg (ref 26.0–34.0)
MCHC: 31.6 g/dL (ref 30.0–36.0)
MCV: 85.5 fL (ref 80.0–100.0)
Platelets: 219 10*3/uL (ref 150–400)
RBC: 4.82 MIL/uL (ref 3.87–5.11)
RDW: 13.8 % (ref 11.5–15.5)
WBC: 7.3 10*3/uL (ref 4.0–10.5)
nRBC: 0 % (ref 0.0–0.2)

## 2021-02-27 LAB — I-STAT BETA HCG BLOOD, ED (MC, WL, AP ONLY): I-stat hCG, quantitative: <5 m[IU]/mL (ref <5)

## 2021-02-27 LAB — TROPONIN I (HIGH SENSITIVITY): Troponin I (High Sensitivity): 4 ng/L (ref <18)

## 2021-02-27 NOTE — ED Provider Notes (Signed)
Emergency Medicine Provider Triage Evaluation Note  Anna Saunders , a 45 y.o. female  was evaluated in triage.  Pt complains of chest pain.  States that she began having nonradiating, central chest pain at 330pm today.  Was sitting when this started.  Associated shortness of breath.  Denies nausea, vomiting, diarrhea or diaphoresis with episode.  Denies aggravating or alleviating factors.  Review of Systems  Positive: Chest pain, shortness of breath Negative: Abdominal pain, nausea  Physical Exam  BP (!) 147/91 (BP Location: Right Arm)   Pulse 75   Temp 98.6 F (37 C) (Oral)   Resp 16   SpO2 99%  Gen:   Awake, no distress   Resp:  Normal effort, lungs clear bilaterally MSK:   Moves extremities without difficulty  Other:  S1 and S2 without appreciable murmurs.  Pulses equal bilaterally.  Abdomen is soft and nontender on exam including epigastric region.  Nonreproducible chest pain on palpation of the chest wall.  Medical Decision Making  Medically screening exam initiated at 8:10 PM.  Appropriate orders placed.  Anna Saunders was informed that the remainder of the evaluation will be completed by another provider, this initial triage assessment does not replace that evaluation, and the importance of remaining in the ED until their evaluation is complete.     Cristopher Peru, PA-C 02/27/21 2012    Melene Plan, DO 02/27/21 2320

## 2021-02-27 NOTE — ED Triage Notes (Signed)
Patient here from home reporting central chest pain non radiating that started at 3pm today. Denies n/v/d.

## 2021-02-28 ENCOUNTER — Encounter (HOSPITAL_COMMUNITY): Payer: Self-pay | Admitting: Student

## 2021-02-28 ENCOUNTER — Emergency Department (HOSPITAL_COMMUNITY): Payer: Self-pay

## 2021-02-28 LAB — HEPATIC FUNCTION PANEL
ALT: 13 U/L (ref 0–44)
AST: 16 U/L (ref 15–41)
Albumin: 4 g/dL (ref 3.5–5.0)
Alkaline Phosphatase: 60 U/L (ref 38–126)
Bilirubin, Direct: <0.1 mg/dL (ref 0.0–0.2)
Total Bilirubin: 0.2 mg/dL — ABNORMAL LOW (ref 0.3–1.2)
Total Protein: 7.7 g/dL (ref 6.5–8.1)

## 2021-02-28 LAB — LIPASE, BLOOD: Lipase: 30 U/L (ref 11–51)

## 2021-02-28 LAB — TROPONIN I (HIGH SENSITIVITY): Troponin I (High Sensitivity): 3 ng/L (ref <18)

## 2021-02-28 MED ORDER — ACETAMINOPHEN 325 MG PO TABS
650.0000 mg | ORAL_TABLET | Freq: Once | ORAL | Status: AC
  Administered 2021-02-28: 650 mg via ORAL
  Filled 2021-02-28: qty 2

## 2021-02-28 MED ORDER — FAMOTIDINE 20 MG PO TABS
20.0000 mg | ORAL_TABLET | Freq: Once | ORAL | Status: AC
  Administered 2021-02-28: 20 mg via ORAL
  Filled 2021-02-28: qty 1

## 2021-02-28 MED ORDER — PANTOPRAZOLE SODIUM 40 MG PO TBEC
40.0000 mg | DELAYED_RELEASE_TABLET | Freq: Every day | ORAL | Status: DC
  Administered 2021-02-28: 40 mg via ORAL
  Filled 2021-02-28: qty 1

## 2021-02-28 MED ORDER — SUCRALFATE 1 G PO TABS
1.0000 g | ORAL_TABLET | Freq: Once | ORAL | Status: AC
  Administered 2021-02-28: 1 g via ORAL
  Filled 2021-02-28: qty 1

## 2021-02-28 MED ORDER — SUCRALFATE 1 GM/10ML PO SUSP: 1.0000 g | Freq: Three times a day (TID) | ORAL | 0 refills | Status: AC

## 2021-02-28 MED ORDER — PANTOPRAZOLE SODIUM 20 MG PO TBEC: 20.0000 mg | DELAYED_RELEASE_TABLET | Freq: Every day | ORAL | 0 refills | Status: AC

## 2021-02-28 MED ORDER — ALUM & MAG HYDROXIDE-SIMETH 200-200-20 MG/5ML PO SUSP
30.0000 mL | Freq: Once | ORAL | Status: AC
  Administered 2021-02-28: 30 mL via ORAL
  Filled 2021-02-28: qty 30

## 2021-02-28 MED ORDER — LIDOCAINE VISCOUS HCL 2 % MT SOLN
15.0000 mL | Freq: Once | OROMUCOSAL | Status: AC
  Administered 2021-02-28: 15 mL via ORAL
  Filled 2021-02-28: qty 15

## 2021-02-28 NOTE — Discharge Instructions (Addendum)
You were seen in the emergency department today for chest pain. Your work-up in the emergency department has been overall reassuring. Your labs have been fairly normal and or similar to previous blood work you have had done. Your EKG and the enzyme we use to check your heart did not show an acute heart attack at this time. Your chest x-ray was normal. Your ultrasound did not show a problem with your gallbladder.   Please try taking protonix once per day in the morning prior to any meals and carafate prior to each meal and prior to bed to try to help with pain.   We have prescribed you new medication(s) today. Discuss the medications prescribed today with your pharmacist as they can have adverse effects and interactions with your other medicines including over the counter and prescribed medications. Seek medical evaluation if you start to experience new or abnormal symptoms after taking one of these medicines, seek care immediately if you start to experience difficulty breathing, feeling of your throat closing, facial swelling, or rash as these could be indications of a more serious allergic reaction  Please follow attached diet guidelines.   We would like you to follow up closely with your primary care provider and/or the cardiologist provided in your discharge instructions within 1-3 days. Return to the ER immediately should you experience any new or worsening symptoms including but not limited to return of pain, worsened pain, vomiting, shortness of breath, dizziness, lightheadedness, passing out, or any other concerns that you may have.

## 2021-02-28 NOTE — ED Provider Notes (Signed)
[facility]-EMERGENCY DEPT Provider Note   CSN: [id number] Arrival date & time: 02/27/21  1950     History Chief Complaint  Patient presents with   Chest Pain    Anna Saunders is a 45 y.o. female who presents to the ED with complaints of chest pain that began @ 15:30 today. Pain to the central chest/epigastrium, feels like an ache, intermittent, occurring @ present & is a 9/10 in severity, no specific alleviating/aggravating factors. No intervention PTA. Has problems with dyspnea/palpitations @ baseline- denies this today. Denies nausea, vomiting, diaphoresis, syncope, calf pain/swelling, hemoptysis, recent surgery/trauma,  hormone use, personal hx of cancer, or hx of DVT/PE.     HPI     Past Medical History:  Diagnosis Date   Heart palpitations     There are no problems to display for this patient.   Past Surgical History:  Procedure Laterality Date   CESAREAN SECTION       OB History     Gravida  4   Para  3   Term  3   Preterm      AB  1   Living  3      SAB  1   IAB      Ectopic      Multiple      Live Births              Family History  Problem Relation Age of Onset   Hypertension Mother    Diabetes Father    Hypertension Maternal Grandmother    Diabetes Paternal Grandfather     Social History   Tobacco Use   Smoking status: Never   Smokeless tobacco: Never  Substance Use Topics   Alcohol use: No   Drug use: No    Home Medications Prior to Admission medications   Medication Sig Start Date End Date Taking? Authorizing Provider  Cholecalciferol (VITAMIN D PO) Take 1 tablet by mouth daily.    [provider]  ciprofloxacin (CIPRO) 500 MG tablet Take 1 tablet (500 mg total) by mouth 2 (two) times daily. 06/25/13   Lesly Dukes, MD  clotrimazole (LOTRIMIN) 1 % cream Apply to affected area 2 times daily 06/22/13   Key, Verita Schneiders, NP  naproxen sodium (ANAPROX) 220 MG tablet Take 220 mg by mouth  daily as needed (for pain).    [provider]  ondansetron (ZOFRAN) 4 MG tablet Take 1 tablet (4 mg total) by mouth every 8 (eight) hours as needed for nausea or vomiting. 06/07/20   Fayrene Helper, PA-C    Allergies    Patient has no known allergies.  Review of Systems   Review of Systems  Constitutional:  Negative for chills, diaphoresis and fever.  Cardiovascular:  Positive for chest pain. Negative for leg swelling.  Gastrointestinal:  Negative for diarrhea, nausea and vomiting.  Neurological:  Negative for syncope.  All other systems reviewed and are negative.  Physical Exam Updated Vital Signs BP (!) 155/113   Pulse 64   Temp 98.6 F (37 C) (Oral)   Resp 18   Ht 5\' 9"  (1.753 m)   Wt 115.2 kg   LMP 02/17/2021 (Approximate)   SpO2 98%   BMI 37.51 kg/m   Physical Exam Vitals and nursing note reviewed.  Constitutional:      General: She is not in acute distress.    Appearance: She is well-developed. She is not toxic-appearing.  HENT:     Head: Normocephalic  and atraumatic.  Eyes:     General:        Right eye: No discharge.        Left eye: No discharge.     Conjunctiva/sclera: Conjunctivae normal.  Cardiovascular:     Rate and Rhythm: Normal rate and regular rhythm.     Pulses:          Radial pulses are 2+ on the right side and 2+ on the left side.  Pulmonary:     Effort: Pulmonary effort is normal. No respiratory distress.     Breath sounds: Normal breath sounds. No wheezing, rhonchi or rales.  Chest:     Chest wall: Tenderness (anterior chest wall) present.  Abdominal:     General: There is no distension.     Palpations: Abdomen is soft.     Tenderness: There is abdominal tenderness (upper abdomen). There is no guarding or rebound.  Musculoskeletal:     Cervical back: Neck supple.     Right lower leg: No tenderness. No edema.     Left lower leg: No tenderness. No edema.  Skin:    General: Skin is warm and dry.     Findings: No rash.   Neurological:     Mental Status: She is alert.     Comments: Clear speech.   Psychiatric:        Behavior: Behavior normal.    ED Results / Procedures / Treatments   Labs (all labs ordered are listed, but only abnormal results are displayed) Labs Reviewed  BASIC METABOLIC PANEL - Abnormal; Notable for the following components:      Result Value   Glucose, Bld 130 (*)    All other components within normal limits  CBC  I-STAT BETA HCG BLOOD, ED ([facility], [facility], [facility] ONLY)  TROPONIN I (HIGH SENSITIVITY)  TROPONIN I (HIGH SENSITIVITY)    EKG None  Radiology DG Chest 2 View  Result Date: 02/27/2021 CLINICAL DATA:  Chest pain, nonradiating central chest pain beginning at 1530 hours today, associated shortness of breath EXAM: CHEST - 2 VIEW COMPARISON:  06/07/2020 FINDINGS: Normal heart size, mediastinal contours, and pulmonary vascularity. Lungs clear. No pleural effusion or pneumothorax. Bones unremarkable. IMPRESSION: Normal exam. Electronically Signed   By: Ulyses Southward M.D.   On: 02/27/2021 20:28   US Abdomen Limited RUQ (LIVER/GB)  Result Date: 02/28/2021 CLINICAL DATA:  Unspecified abdominal pain EXAM: ULTRASOUND ABDOMEN LIMITED RIGHT UPPER QUADRANT COMPARISON:  None. FINDINGS: Gallbladder: No gallstones or wall thickening visualized. No sonographic Murphy sign noted by sonographer. Common bile duct: Diameter: 4 mm in proximal diameter Liver: No focal lesion identified. Within normal limits in parenchymal echogenicity. Portal vein is patent on color Doppler imaging with normal direction of blood flow towards the liver. Other: None. IMPRESSION: Normal right upper quadrant sonogram Electronically Signed   By: Helyn Numbers M.D.   On: 02/28/2021 01:38    Procedures Procedures   Medications Ordered in ED Medications - No data to display  ED Course  I have reviewed the triage vital signs and the nursing notes.  Pertinent labs & imaging results that were available during my care of the  patient were reviewed by me and considered in my medical decision making (see chart for details).    MDM Rules/Calculators/A&P                          Patient presents to the ED with complaints of chest pain. Nontoxic,  vitals with intermittent elevated BP. Exam with chest wall and upper abdominal tenderness, no peritoneal signs.    Additional history obtained:  Additional history obtained from chart review & nursing note review.   Lab Tests:  I reviewed and interpreted labs, which included:  CBC, BMP, hepatic function panel, lipase, troponins, preg test: unremarkable.   Imaging Studies ordered:  I ordered imaging studies which included RUQ [place] in addition to CXR ordered by triage, I independently reviewed, formal radiology impression shows:  CXR: Normal exam. RUQ [place]:  Normal right upper quadrant sonogram  ED Course:  Heart Pathway Score low risk- EKG without obvious acute ischemia, delta troponin negative, doubt ACS. Patient is low risk wells, doubt pulmonary embolism. Pain is not a tearing sensation, symmetric pulses, no widening of mediastinum on CXR, doubt dissection. Abdominal exam without peritoneal signs, RUQ [place] negative, labs reassuring, doubt acute surgical abdominal pathology. Given tx for GERD/PUD and tylenol in the ED with improvement, will trial PPI & carafate with PCP follow up, cardiology information also provided.   I discussed results, treatment plan, need for follow-up, and return precautions with the patient. Provided opportunity for questions, patient confirmed understanding and is in agreement with plan.   Portions of this note were generated with Scientist, clinical (histocompatibility and immunogenetics). Dictation errors may occur despite best attempts at proofreading.  Final Clinical Impression(s) / ED Diagnoses Final diagnoses:  Abdominal pain  Chest pain, unspecified type    Rx / DC Orders ED Discharge Orders          Ordered    pantoprazole (PROTONIX) 20 MG tablet  Daily         02/28/21 0454    sucralfate (CARAFATE) 1 GM/10ML suspension  3 times daily with meals & bedtime        02/28/21 0454             Leialoha Hanna, Pleas Koch, PA-C 02/28/21 0258    Geoffery Lyons, MD 02/28/21 [phone]

## 2021-12-24 IMAGING — DX DG CHEST 1V PORT
1 series · 1 of 1 positions shown · non-contrast
Comparison: None.

CLINICAL DATA: Chest pain

EXAM:
PORTABLE CHEST 1 VIEW

[chest ap]
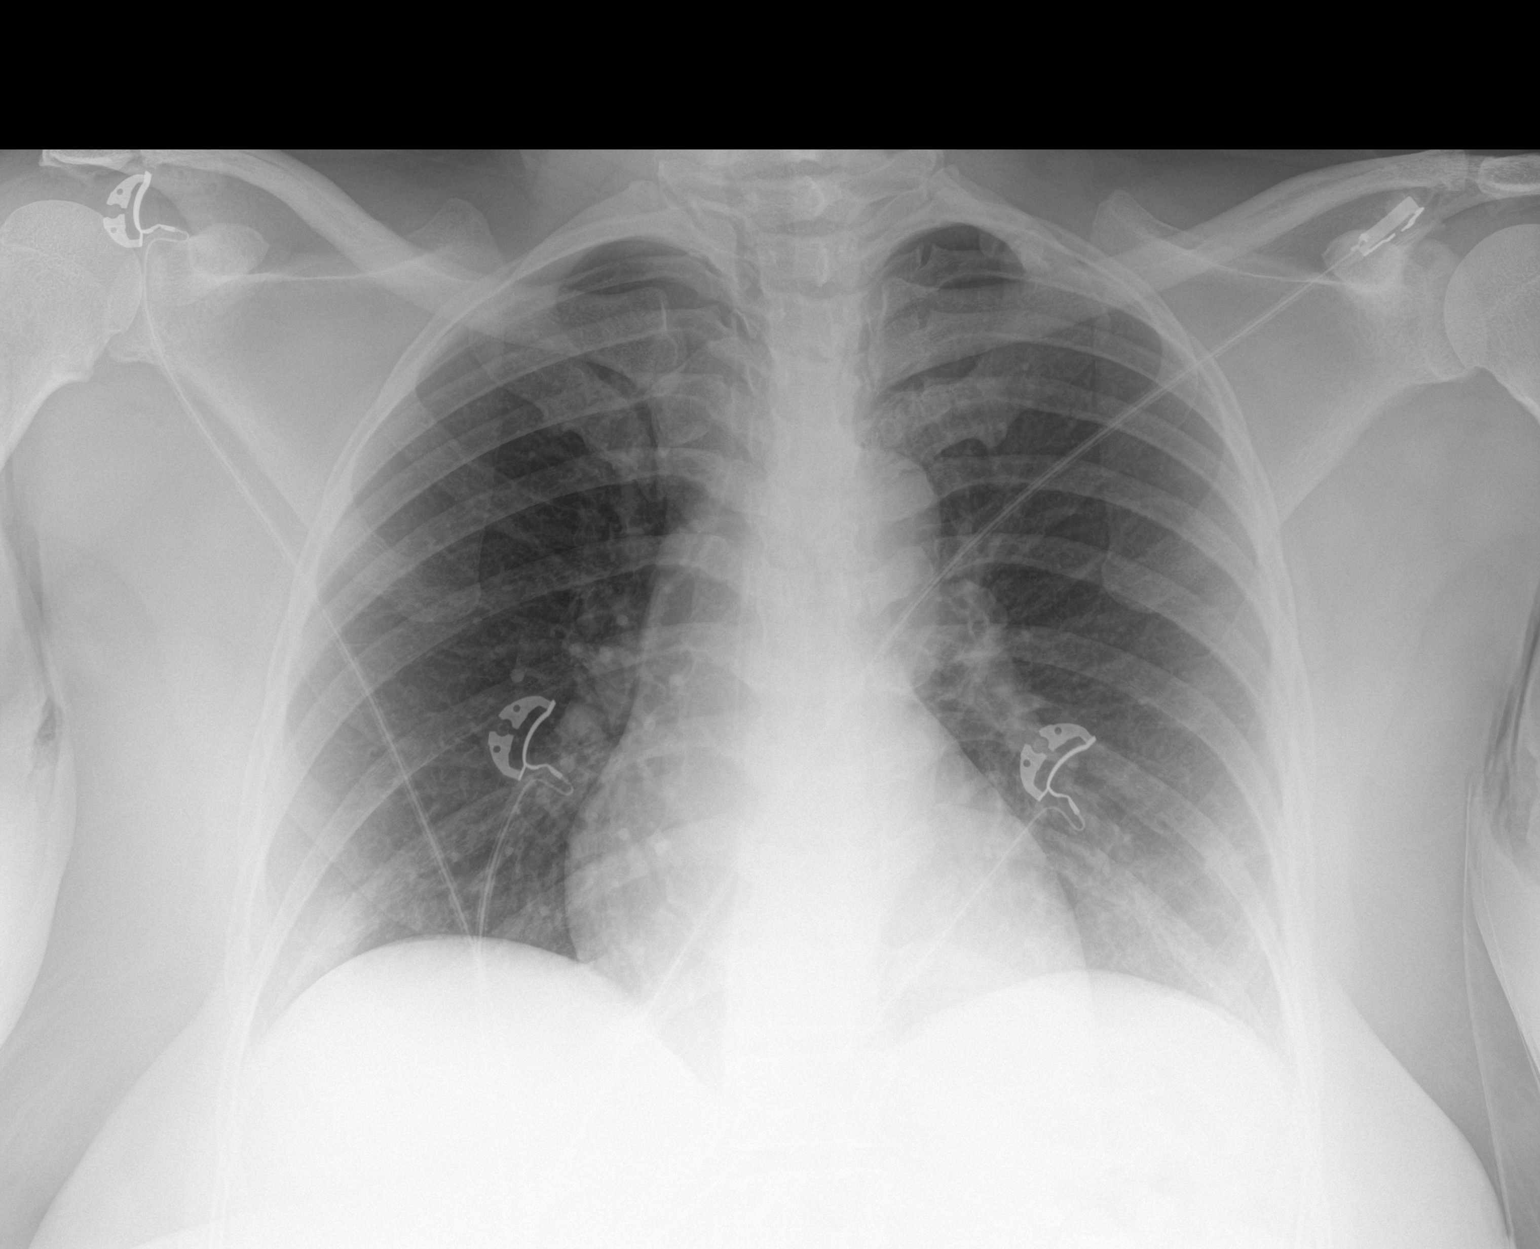

[1 of 1 positions shown; findings below may reference images not displayed]

FINDINGS: The heart size and mediastinal contours are within normal limits.
Hazy airspace opacities seen at both lung bases. No large airspace
consolidation or pleural effusion. The visualized skeletal
structures are unremarkable.
IMPRESSION: Hazy airspace opacity at both lung bases which could be due to early
infectious etiology or atelectasis.

## 2022-04-02 ENCOUNTER — Ambulatory Visit: Payer: Medicaid Other | Admitting: Physician Assistant

## 2022-04-09 ENCOUNTER — Ambulatory Visit: Payer: Medicaid Other | Admitting: Physician Assistant

## 2023-03-08 ENCOUNTER — Encounter: Payer: Self-pay | Admitting: Diagnostic Neuroimaging

## 2023-03-08 ENCOUNTER — Ambulatory Visit: Payer: Medicaid Other | Admitting: Diagnostic Neuroimaging

## 2023-03-08 VITALS — BP 111/76 | HR 68 | Ht 68.0 in | Wt 249.0 lb

## 2023-03-08 DIAGNOSIS — M5441 Lumbago with sciatica, right side: Secondary | ICD-10-CM | POA: Diagnosis not present

## 2023-03-08 DIAGNOSIS — M5442 Lumbago with sciatica, left side: Secondary | ICD-10-CM | POA: Diagnosis not present

## 2023-03-08 DIAGNOSIS — G8929 Other chronic pain: Secondary | ICD-10-CM

## 2023-03-08 NOTE — Progress Notes (Signed)
[facility]  PATIENT: Anna Saunders DOB: [birth year]  REFERRING CLINICIAN: Harlen Labs, NP HISTORY FROM: patient  REASON FOR VISIT: new consult   HISTORICAL  CHIEF COMPLAINT:  Chief Complaint  Patient presents with   New Patient (Initial Visit)    Patient in room #6 and alone. Patient states she been having pain, numbness and tingling down both legs. Patient states she having some pain and numbness in her left hand.    HISTORY OF PRESENT ILLNESS:   47 year old female here for evaluation of low back pain.  Symptoms started about 3 years ago.  Describes burning pain in the lower back, radiating to bilateral lower extremities, left worse than right.  Symptoms worse with sitting for prolonged time.  Also has some intermittent left hand numbness.     REVIEW OF SYSTEMS: Full 14 system review of systems performed and negative with exception of: as per HPI.  ALLERGIES: No Known Allergies  HOME MEDICATIONS: Outpatient Medications Prior to Visit  Medication Sig Dispense Refill   ACCU-CHEK GUIDE test strip 1 each by Other route as needed.     Accu-Chek Softclix Lancets lancets by Other route 2 (two) times daily as needed.     Blood Glucose Monitoring Suppl (ACCU-CHEK GUIDE ME) w/Device KIT See admin instructions.     Cholecalciferol (VITAMIN D PO) Take 1 tablet by mouth daily.     gabapentin (NEURONTIN) 100 MG capsule Take 100 mg by mouth daily.     metFORMIN (GLUCOPHAGE) 500 MG tablet Take 500 mg by mouth daily with breakfast.     naproxen sodium (ANAPROX) 220 MG tablet Take 220 mg by mouth daily as needed (for pain).     ciprofloxacin (CIPRO) 500 MG tablet Take 1 tablet (500 mg total) by mouth 2 (two) times daily. (Patient not taking: Reported on 03/08/2023) 10 tablet 0   clotrimazole (LOTRIMIN) 1 % cream Apply to affected area 2 times daily (Patient not taking: Reported on 03/08/2023) 15 g 0   omeprazole (PRILOSEC) 20 MG capsule Take 20 mg by mouth  daily. (Patient not taking: Reported on 03/08/2023)     ondansetron (ZOFRAN) 4 MG tablet Take 1 tablet (4 mg total) by mouth every 8 (eight) hours as needed for nausea or vomiting. (Patient not taking: Reported on 03/08/2023) 12 tablet 0   pantoprazole (PROTONIX) 20 MG tablet Take 1 tablet (20 mg total) by mouth daily. (Patient not taking: Reported on 03/08/2023) 30 tablet 0   sucralfate (CARAFATE) 1 GM/10ML suspension Take 10 mLs (1 g total) by mouth 4 (four) times daily -  with meals and at bedtime. (Patient not taking: Reported on 03/08/2023) 420 mL 0   No facility-administered medications prior to visit.    PAST MEDICAL HISTORY: Past Medical History:  Diagnosis Date   Heart palpitations     PAST SURGICAL HISTORY: Past Surgical History:  Procedure Laterality Date   CESAREAN SECTION      FAMILY HISTORY: Family History  Problem Relation Age of Onset   Hypertension Mother    Diabetes Father    Hypertension Maternal Grandmother    Diabetes Paternal Grandfather     SOCIAL HISTORY: Social History   Socioeconomic History   Marital status: Married    Spouse name: Not on file   Number of children: Not on file   Years of education: Not on file   Highest education level: Not on file  Occupational History   Not on file  Tobacco Use   Smoking status: Never  Smokeless tobacco: Never  Substance and Sexual Activity   Alcohol use: No   Drug use: No   Sexual activity: Not on file  Other Topics Concern   Not on file  Social History Narrative   Not on file   Social Determinants of Health   Financial Resource Strain: Low Risk  (09/20/2022)   Received from [facility]   Overall Financial Resource Strain (CARDIA)    Difficulty of Paying Living Expenses: Not hard at all  Food Insecurity: No Food Insecurity (09/20/2022)   Received from [facility]   Hunger Vital Sign    Worried About Running Out of Food in the Last Year: Never true    Ran Out of Food in the Last Year: Never  true  Transportation Needs: No Transportation Needs (09/20/2022)   Received from [facility] - Transportation    Lack of Transportation (Medical): No    Lack of Transportation (Non-Medical): No  Physical Activity: Insufficiently Active (09/20/2022)   Received from [facility]   Exercise Vital Sign    Days of Exercise per Week: 1 day    Minutes of Exercise per Session: 30 min  Stress: No Stress Concern Present (09/20/2022)   Received from [facility] - Occupational Stress Questionnaire    Feeling of Stress : Not at all  Social Connections: Somewhat Isolated (09/20/2022)   Received from [facility]   Social Network    How would you rate your social network (family, work, friends)?: Restricted participation with some degree of social isolation  Intimate Partner Violence: Not At Risk (09/20/2022)   Received from [facility]   HITS    Over the last 12 months how often did your partner physically hurt you?: 1    Over the last 12 months how often did your partner insult you or talk down to you?: 1    Over the last 12 months how often did your partner threaten you with physical harm?: 1    Over the last 12 months how often did your partner scream or curse at you?: 1     PHYSICAL EXAM  GENERAL EXAM/CONSTITUTIONAL: Vitals:  Vitals:   03/08/23 1058  BP: 111/76  Pulse: 68  Weight: 249 lb (112.9 kg)  Height: 5\' 8"  (1.727 m)   Body mass index is 37.86 kg/m. Wt Readings from Last 3 Encounters:  03/08/23 249 lb (112.9 kg)  02/28/21 254 lb (115.2 kg)  05/29/19 260 lb (117.9 kg)   Patient is in no distress; well developed, nourished and groomed; neck is supple  CARDIOVASCULAR: Examination of carotid arteries is normal; no carotid bruits Regular rate and rhythm, no murmurs Examination of peripheral vascular system by observation and palpation is normal  EYES: Ophthalmoscopic exam of optic discs and posterior segments is  normal; no papilledema or hemorrhages No results found.  MUSCULOSKELETAL: Gait, strength, tone, movements noted in Neurologic exam below  NEUROLOGIC: MENTAL STATUS:      No data to display         awake, alert, oriented to person, place and time recent and remote memory intact normal attention and concentration language fluent, comprehension intact, naming intact fund of knowledge appropriate  CRANIAL NERVE:  2nd - no papilledema on fundoscopic exam 2nd, 3rd, 4th, 6th - pupils equal and reactive to light, visual fields full to confrontation, extraocular muscles intact, no nystagmus 5th - facial sensation symmetric 7th - facial strength symmetric 8th - hearing intact 9th -  palate elevates symmetrically, uvula midline 11th - shoulder shrug symmetric 12th - tongue protrusion midline  MOTOR:  normal bulk and tone, full strength in the BUE, BLE  SENSORY:  normal and symmetric to light touch, temperature, vibration  COORDINATION:  finger-nose-finger, fine finger movements normal  REFLEXES:  deep tendon reflexes 1+ and symmetric  GAIT/STATION:  narrow based gait     DIAGNOSTIC DATA (LABS, IMAGING, TESTING) - I reviewed patient records, labs, notes, testing and imaging myself where available.  Lab Results  Component Value Date   WBC 7.3 02/27/2021   HGB 13.0 02/27/2021   HCT 41.2 02/27/2021   MCV 85.5 02/27/2021   PLT 219 02/27/2021      Component Value Date/Time   NA 142 02/27/2021 2045   K 3.8 02/27/2021 2045   CL 108 02/27/2021 2045   CO2 26 02/27/2021 2045   GLUCOSE 130 (H) 02/27/2021 2045   BUN 16 02/27/2021 2045   CREATININE 0.97 02/27/2021 2045   CALCIUM 9.3 02/27/2021 2045   PROT 7.7 02/27/2021 2045   ALBUMIN 4.0 02/27/2021 2045   AST 16 02/27/2021 2045   ALT 13 02/27/2021 2045   ALKPHOS 60 02/27/2021 2045   BILITOT 0.2 (L) 02/27/2021 2045   GFRNONAA >60 02/27/2021 2045   No results found for: "CHOL", "HDL", "LDLCALC", "LDLDIRECT", "TRIG",  "CHOLHDL" No results found for: "HGBA1C" No results found for: "VITAMINB12" No results found for: "TSH"     ASSESSMENT AND PLAN  47 y.o. year old female here with:   Dx:  1. Chronic bilateral low back pain with bilateral sciatica     PLAN:  LOW BACK PAIN, BURNING SENSATION, PAIN IN LEGS AND FEET (worse with prolonged sitting) - refer to PT evaluation; if not better in 6 weeks, then check MRI lumbar spine for radiculopathy / spinal stenosis evaluation  INTERMITTENT LEFT HAND NUMBNESS (ulnar distribution; neuro exam normal) - avoid compression at left elbow / wrist  Orders Placed This Encounter  Procedures   Ambulatory referral to Physical Therapy   Return for pending test results.    Suanne Marker, MD 03/08/2023, 11:22 AM Certified in Neurology, Neurophysiology and Neuroimaging  [facility] [address], [address] [place], [place] [address] [phone]

## 2023-03-08 NOTE — Patient Instructions (Signed)
  LOW BACK PAIN, BURNING SENSATION, PAIN IN LEGS AND FEET - refer to PT evaluation; if not better in 6 weeks, then check MRI lumbar spine

## 2023-03-16 NOTE — Therapy (Signed)
OUTPATIENT PHYSICAL THERAPY THORACOLUMBAR EVALUATION   Patient Name: Anna Saunders MRN: [id number] DOB:18-Mar-1976, 47 y.o., female Today's Date: 03/17/2023  END OF SESSION:  PT End of Session - 03/17/23 0937     Visit Number 1    Date for PT Re-Evaluation 05/12/23    Authorization Type [place] Medicaid $4    Authorization Time Period pending    PT Start Time 0935    PT Stop Time 1010    PT Time Calculation (min) 35 min    Activity Tolerance Patient tolerated treatment well    Behavior During Therapy WFL for tasks assessed/performed             Past Medical History:  Diagnosis Date   Heart palpitations    Past Surgical History:  Procedure Laterality Date   CESAREAN SECTION     There are no problems to display for this patient.   PCP: [facility]   REFERRING PROVIDER: Suanne Marker, MD   REFERRING DIAG: M54.42,M54.41,G89.29 (ICD-10-CM) - Chronic bilateral low back pain with bilateral sciatica   Rationale for Evaluation and Treatment: Rehabilitation  THERAPY DIAG:  Chronic left-sided low back pain with left-sided sciatica - Plan: PT plan of care cert/re-cert  Muscle weakness (generalized) - Plan: PT plan of care cert/re-cert  Cramp and spasm  ONSET DATE: 3 years ago  SUBJECTIVE:                                                                                                                                                                                           SUBJECTIVE STATEMENT: Back pain started 3 years ago, worsened over last year, burning in low back started going down her left leg all the way to foot (indicates toes and instep) also in her L hand, pain in bed has to keep tossing and turning, she does a lot of bending and lifting while cooking and cleaning at home, but pain is mostly in her low back, burning inside.    PERTINENT HISTORY:  C-section x 3, pre-diabetes, obesity, chronic knee pain bil  PAIN:  Are  you having pain? Yes: NPRS scale: 6/10 Pain location: low back intermittent radiating down L foot to toes Pain description: burning Aggravating factors: standing and cleaning all day  Relieving factors: pain medication - ibuprofen  PRECAUTIONS: None  RED FLAGS: None   WEIGHT BEARING RESTRICTIONS: No  FALLS:  Has patient fallen in last 6 months? 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 Slight lumbar curvature, convexity to the left. No acute abnormality.   PATIENT SURVEYS:  Modified Oswestry 13/50 = 26% moderate disability   COGNITION: Overall cognitive status: Within functional limits for tasks assessed     SENSATION: Light touch: WFL  MUSCLE LENGTH: Hamstrings: Right 90 deg; Left 90 deg Quadriceps: Moderated tightness bil, measured in prone, increased LBP and burning with knee bend  POSTURE: rounded shoulders  PALPATION: Tenderness with PA mobs L3-5, L erector spinae, QL, L SIJ, L glute med and piriformis.   LUMBAR ROM:   03/17/23- all movements provoked pain on Left low back with increased radiation to LLE, but worse with extension, L SB and L rotation.   AROM eval  Flexion To ankles, pain L low back  Extension Limited 50%, pain L low back  Right lateral flexion To knee, pain  Left lateral flexion To knee, p!  Right rotation WNL, pain  Left rotation WNL, p!   (Blank rows = not tested)    LOWER EXTREMITY ROM:   tightness bil hips but symmetric and WFL   LOWER EXTREMITY MMT:    MMT Left Eval (seated) Right Eval (Seated)  Hip flexion 4+p! 5  Hip extension 4 prone 4 prone   Hip abduction 5 5  Hip adduction 4+ 4+  Knee flexion 5 5  Knee extension 5 5  Ankle dorsiflexion 5 WB 5 WB  Ankle plantarflexion 5 WB 5 WB   (Blank rows = not tested)  LUMBAR SPECIAL TESTS:  Straight leg raise test: Negative, FABER test: Negative, and Long sit test: Negative  FUNCTIONAL TESTS:  5 times sit to stand: 17 seconds, no UE assist, table height raised to put legs in 90/90 position to accommodate height.   GAIT: Distance walked: 64' Assistive device utilized: None Level of assistance: Complete Independence Comments: no significant deviation  TODAY'S TREATMENT:                                                                                                                              DATE:   03/17/23 Eval only     PATIENT EDUCATION:  Education details: findings, POC Person educated: Patient Education method: Explanation Education comprehension: verbalized understanding  HOME EXERCISE PROGRAM: TBD  ASSESSMENT:  CLINICAL IMPRESSION: Anna Saunders  is a 47 y.o. female who was seen today for physical therapy evaluation and treatment for chronic LBP.  She reports LBP of insidious onset starting about 3 years ago, with intermittent radiation down LLE to foot.   Sensation intact and strength good, with no changes in bowel or bladder control.  Noted pain provoked more with extension, L lateral SB and L rotation, with increased tenderness in L lumbar spine, erector spinae and glutes.  Discussed plan of care, importance of core strengthening considering her 3 sections, and TrDN as well.  She had some questions about occasional pain in her hand, explained anatomy and nerves, and this was unalikely to be connected to her back.   Anna Saunders would benefit from skilled physical therapy to centralize pain, improve activity tolerance and improve quality of life.     OBJECTIVE IMPAIRMENTS: decreased activity tolerance, decreased endurance, difficulty walking, decreased  ROM, decreased strength, hypomobility, increased fascial restrictions, increased muscle spasms, impaired flexibility, and pain.   ACTIVITY LIMITATIONS: carrying, lifting, bending, standing, sleeping, locomotion level, and caring for others  PARTICIPATION LIMITATIONS: meal prep, cleaning, laundry, occupation, and yard work  PERSONAL FACTORS: Time since onset of injury/illness/exacerbation and 1-2 comorbidities: history of C-section  are also affecting patient's functional outcome.   REHAB POTENTIAL: Good  CLINICAL DECISION MAKING: Stable/uncomplicated  EVALUATION COMPLEXITY: Low   GOALS: Goals reviewed with patient? Yes  SHORT TERM GOALS: Target date: 03/31/2023   Patient will be independent with initial HEP.  Baseline: needs Goal status: INITIAL  2.  Complete FOTO within 2 visits Baseline:  Goal status: INITIAL    LONG TERM GOALS: Target date: 05/12/2023   Patient will be independent with advanced/ongoing HEP to improve outcomes and carryover.  Baseline:  Goal status: INITIAL  2.  Patient will report 75% improvement in low back pain to improve QOL.  Baseline:  Goal status: INITIAL  3.  Patient will demonstrate full pain free lumbar ROM to perform ADLs.   Baseline: see objective Goal status: INITIAL  4. Patient will report at least 6 points improvement on modified Oswestry to demonstrate improved functional ability.  Baseline: 13/50 Goal status: INITIAL   5.   Patient will report centralization of radicular symptoms.  Baseline: radiates to L foot Goal status: INITIAL  6. Patient will report expected outcome on FOTO Baseline: TBD Goal status: INITIAL   PLAN:  PT FREQUENCY: 1-2x/week  PT DURATION: 8 weeks  PLANNED INTERVENTIONS: 97110-Therapeutic exercises, 97530- Therapeutic activity, 97112- Neuromuscular re-education, 97535- Self Care, 96045- Manual therapy, 97760- Orthotic Fit/training, 97014- Electrical stimulation (unattended), [id number]- Electrical  stimulation (manual), [id number]- Ultrasound, 40981- Traction (mechanical), Balance training, Stair training, Taping, Dry Needling, Joint mobilization, Joint manipulation, Spinal manipulation, Spinal mobilization, Cryotherapy, and Moist heat  PLAN FOR NEXT SESSION: HEP for core strengthening, flexion preference, TrDN to lumbar multifidi and glutes?      Jena Gauss, PT, DPT  03/17/2023, 10:52 AM

## 2023-03-17 ENCOUNTER — Other Ambulatory Visit: Payer: Self-pay

## 2023-03-17 ENCOUNTER — Ambulatory Visit: Payer: Medicaid Other | Attending: Diagnostic Neuroimaging | Admitting: Physical Therapy

## 2023-03-17 DIAGNOSIS — M6281 Muscle weakness (generalized): Secondary | ICD-10-CM | POA: Insufficient documentation

## 2023-03-17 DIAGNOSIS — G8929 Other chronic pain: Secondary | ICD-10-CM | POA: Diagnosis present

## 2023-03-17 DIAGNOSIS — R252 Cramp and spasm: Secondary | ICD-10-CM | POA: Diagnosis present

## 2023-03-17 DIAGNOSIS — M5442 Lumbago with sciatica, left side: Secondary | ICD-10-CM | POA: Diagnosis present

## 2023-03-17 DIAGNOSIS — M5441 Lumbago with sciatica, right side: Secondary | ICD-10-CM | POA: Diagnosis not present

## 2023-03-24 ENCOUNTER — Ambulatory Visit: Payer: Medicaid Other | Admitting: Physical Therapy

## 2023-03-24 ENCOUNTER — Encounter: Payer: Self-pay | Admitting: Physical Therapy

## 2023-03-24 DIAGNOSIS — G8929 Other chronic pain: Secondary | ICD-10-CM

## 2023-03-24 DIAGNOSIS — R252 Cramp and spasm: Secondary | ICD-10-CM

## 2023-03-24 DIAGNOSIS — M6281 Muscle weakness (generalized): Secondary | ICD-10-CM

## 2023-03-24 DIAGNOSIS — M5442 Lumbago with sciatica, left side: Secondary | ICD-10-CM | POA: Diagnosis not present

## 2023-03-24 NOTE — Patient Instructions (Signed)

## 2023-03-24 NOTE — Therapy (Signed)
OUTPATIENT PHYSICAL THERAPY TREATMENT   Patient Name: Anna Saunders MRN: [id number] DOB:June 18, 1975, 47 y.o., female Today's Date: 03/24/2023  END OF SESSION:  PT End of Session - 03/24/23 0843     Visit Number 2    Date for PT Re-Evaluation 05/12/23    Authorization Type [place] Medicaid $4    Authorization Time Period 03/17/23-05/15/23    Authorization - Visit Number 1    Authorization - Number of Visits 5    PT Start Time 0845    PT Stop Time 0930    PT Time Calculation (min) 45 min    Activity Tolerance Patient tolerated treatment well    Behavior During Therapy WFL for tasks assessed/performed             Past Medical History:  Diagnosis Date   Heart palpitations    Past Surgical History:  Procedure Laterality Date   CESAREAN SECTION     There are no problems to display for this patient.   PCP: [facility]   REFERRING PROVIDER: Suanne Marker, MD   REFERRING DIAG: M54.42,M54.41,G89.29 (ICD-10-CM) - Chronic bilateral low back pain with bilateral sciatica   Rationale for Evaluation and Treatment: Rehabilitation  THERAPY DIAG:  Chronic left-sided low back pain with left-sided sciatica  Muscle weakness (generalized)  Cramp and spasm  ONSET DATE: 3 years ago  SUBJECTIVE:                                                                                                                                                                                           SUBJECTIVE STATEMENT: Pain is fine.  Pain in in L glute and L heel.     PERTINENT HISTORY:  C-section x 3, pre-diabetes, obesity, chronic knee pain bil  PAIN:  Are you having pain? Yes: NPRS scale: 6/10 Pain location: low back intermittent radiating down L foot to toes Pain description: burning Aggravating factors: standing and cleaning all day  Relieving factors: pain medication - ibuprofen  PRECAUTIONS: None  RED FLAGS: None   WEIGHT BEARING  RESTRICTIONS: No  FALLS:  Has patient fallen in last 6 months? No  LIVING ENVIRONMENT: Lives with: lives with their family Lives in: House/apartment Stairs: Yes: Internal: 14 steps; on left going up and External: 2 steps; none Has following equipment at home: None  OCCUPATION: homemaker  PLOF: Independent  PATIENT GOALS: get rid of pain  NEXT MD VISIT: not scheduled yet  OBJECTIVE:   DIAGNOSTIC FINDINGS:  03/16/23 XR Spine Lumbar IMPRESSION: Mild degenerative disc disease L4-5. Slight lumbar curvature, convexity to the left. No acute abnormality.   PATIENT  SURVEYS:  Modified Oswestry 13/50 = 26% moderate disability   COGNITION: Overall cognitive status: Within functional limits for tasks assessed     SENSATION: Light touch: WFL  MUSCLE LENGTH: Hamstrings: Right 90 deg; Left 90 deg Quadriceps: Moderated tightness bil, measured in prone, increased LBP and burning with knee bend  POSTURE: rounded shoulders  PALPATION: Tenderness with PA mobs L3-5, L erector spinae, QL, L SIJ, L glute med and piriformis.   LUMBAR ROM:   03/17/23- all movements provoked pain on Left low back with increased radiation to LLE, but worse with extension, L SB and L rotation.   AROM eval  Flexion To ankles, pain L low back  Extension Limited 50%, pain L low back  Right lateral flexion To knee, pain  Left lateral flexion To knee, p!  Right rotation WNL, pain  Left rotation WNL, p!   (Blank rows = not tested)    LOWER EXTREMITY ROM:   tightness bil hips but symmetric and WFL   LOWER EXTREMITY MMT:    MMT Left Eval (seated) Right Eval (Seated)  Hip flexion 4+p! 5  Hip extension 4 prone 4 prone  Hip abduction 5 5  Hip adduction 4+ 4+  Knee flexion 5 5  Knee extension 5 5  Ankle dorsiflexion 5 WB 5 WB  Ankle plantarflexion 5 WB 5 WB   (Blank rows = not tested)  LUMBAR SPECIAL TESTS:  Straight leg raise test: Negative, FABER test: Negative, and Long sit test:  Negative  FUNCTIONAL TESTS:  5 times sit to stand: 17 seconds, no UE assist, table height raised to put legs in 90/90 position to accommodate height.   GAIT: Distance walked: 91' Assistive device utilized: None Level of assistance: Complete Independence Comments: no significant deviation  TODAY'S TREATMENT:                                                                                                                              DATE:   03/24/23 Therapeutic Exercise: to improve strength and mobility.  Demo, verbal and tactile cues throughout for technique. Nustep L5 x 6 min  Seated sciatic nerve glide LTR x 10 PPT x 10 PPT with hold and diaphragmatic breathingk x 10  SKTC x 3 Sciatic nerve glide x 10 R/L KTOS stretch x 3 Supine PPT with pillow squeeze x 10 with 5 sec hold Manual Therapy: to decrease muscle spasm and pain and improve mobility STM/TPR to L lumbar paraspinals, L UPA mobs to lumbar spine grade 3-4, skilled palpation and monitoring during dry needling. IASTM with foam roller to L glutes, QL, erector spinae Trigger Point Dry-Needling  Treatment instructions: Expect mild to moderate muscle soreness. S/S of pneumothorax if dry needled over a lung field, and to seek immediate medical attention should they occur. Patient verbalized understanding of these instructions and education. Patient Consent Given: Yes Education handout provided: Yes Muscles treated: L lumbar multifidi L3-5, L glut med/max, L piriformis Electrical stimulation  performed: No Parameters: N/A Treatment response/outcome: Twitch Response Elicited and Palpable Increase in Muscle Length   03/17/23 Eval only     PATIENT EDUCATION:  Education details: TrDN, HEP Person educated: Patient Education method: Explanation, Demonstration, Verbal cues, and Handouts Education comprehension: verbalized understanding and returned demonstration  HOME EXERCISE PROGRAM: Access Code: [email or web address] URL:  [email or web address] Date: 03/24/2023 Prepared by: Harrie Foreman  Exercises - Supine Posterior Pelvic Tilt with Knee Rocks  - 1 x daily - 7 x weekly - 3 sets - 10 reps - Supine Posterior Pelvic Tilt  - 1 x daily - 7 x weekly - 1 sets - 10 reps -   hold - Posterior Pelvic Tilt with Adduction Using Pillow in Hooklying  - 1 x daily - 7 x weekly - 3 sets - 10 reps - 5- 10 sec  hold - Hooklying Single Knee to Chest Stretch  - 1 x daily - 7 x weekly - 1 sets - 5 reps - 15 sec  hold - Supine Sciatic Nerve Glide  - 1 x daily - 7 x weekly - 1 sets - 10 reps - Supine Piriformis Stretch with Foot on Ground  - 1 x daily - 7 x weekly - 1 sets - 3 reps - 30 sec hold  ASSESSMENT:  CLINICAL IMPRESSION: Anna Saunders reports continues symptoms today with pain in L glutes and heel.  Today established HEP for gentle neutral spine lumbopelvic strengthening, needed cues throughout for breathing.  Noted pain and TrP in L lumbar multifidi and glutes with manual therapy, after explanation of DN rational, procedures, outcomes and potential side effects, patient verbalized consent to DN treatment in conjunction with manual STM/DTM and TPR to reduce ttp/muscle tension. Muscles treated as indicated above. DN produced normal response with good twitches elicited resulting in palpable reduction in pain/ttp and muscle tension, with patient noting less pain upon initiation of movement following DN. Pt educated to expect mild to moderate muscle soreness for up to 24-48 hrs and instructed to continue prescribed home exercise program and current activity level with pt verbalizing understanding of theses instructions. Anna Saunders continues to demonstrate potential for improvement and would benefit from continued skilled therapy to address impairments.     OBJECTIVE IMPAIRMENTS: decreased activity tolerance, decreased endurance, difficulty walking, decreased ROM, decreased strength, hypomobility, increased  fascial restrictions, increased muscle spasms, impaired flexibility, and pain.   ACTIVITY LIMITATIONS: carrying, lifting, bending, standing, sleeping, locomotion level, and caring for others  PARTICIPATION LIMITATIONS: meal prep, cleaning, laundry, occupation, and yard work  PERSONAL FACTORS: Time since onset of injury/illness/exacerbation and 1-2 comorbidities: history of C-section  are also affecting patient's functional outcome.   REHAB POTENTIAL: Good  CLINICAL DECISION MAKING: Stable/uncomplicated  EVALUATION COMPLEXITY: Low   GOALS: Goals reviewed with patient? Yes  SHORT TERM GOALS: Target date: 03/31/2023   Patient will be independent with initial HEP.  Baseline: needs Goal status: IN PROGRESS 03/24/23 given  2.  Complete FOTO within 2 visits Baseline:  Goal status: IN PROGRESS    LONG TERM GOALS: Target date: 05/12/2023   Patient will be independent with advanced/ongoing HEP to improve outcomes and carryover.  Baseline:  Goal status: IN PROGRESS  2.  Patient will report 75% improvement in low back pain to improve QOL.  Baseline:  Goal status: IN PROGRESS  3.  Patient will demonstrate full pain free lumbar ROM to perform ADLs.   Baseline: see objective Goal status: IN PROGRESS  4. Patient will report at least 6  points improvement on modified Oswestry to demonstrate improved functional ability.  Baseline: 13/50 Goal status: IN PROGRESS   5.   Patient will report centralization of radicular symptoms.  Baseline: radiates to L foot Goal status: IN PROGRESS  6. Patient will report expected outcome on FOTO Baseline: TBD Goal status: IN PROGRESS   PLAN:  PT FREQUENCY: 1-2x/week  PT DURATION: 8 weeks  PLANNED INTERVENTIONS: 97110-Therapeutic exercises, 97530- Therapeutic activity, 97112- Neuromuscular re-education, 97535- Self Care, 62831- Manual therapy, 97760- Orthotic Fit/training, 97014- Electrical stimulation (unattended), [id number]- Electrical  stimulation (manual), [id number]- Ultrasound, 51761- Traction (mechanical), Balance training, Stair training, Taping, Dry Needling, Joint mobilization, Joint manipulation, Spinal manipulation, Spinal mobilization, Cryotherapy, and Moist heat  PLAN FOR NEXT SESSION: HEP for core strengthening, flexion preference, TrDN to lumbar multifidi and glutes?      Jena Gauss, PT, DPT  03/24/2023, 11:31 AM

## 2023-03-31 ENCOUNTER — Ambulatory Visit: Payer: Medicaid Other | Attending: Diagnostic Neuroimaging | Admitting: Physical Therapy

## 2023-03-31 ENCOUNTER — Encounter: Payer: Self-pay | Admitting: Physical Therapy

## 2023-03-31 DIAGNOSIS — R252 Cramp and spasm: Secondary | ICD-10-CM | POA: Diagnosis present

## 2023-03-31 DIAGNOSIS — G8929 Other chronic pain: Secondary | ICD-10-CM

## 2023-03-31 DIAGNOSIS — M5442 Lumbago with sciatica, left side: Secondary | ICD-10-CM | POA: Diagnosis present

## 2023-03-31 DIAGNOSIS — M6281 Muscle weakness (generalized): Secondary | ICD-10-CM | POA: Diagnosis present

## 2023-03-31 NOTE — Therapy (Signed)
OUTPATIENT PHYSICAL THERAPY TREATMENT   Patient Name: Anna Saunders MRN: [id number] DOB:[birth year], 47 y.o., female Today's Date: 03/31/2023  END OF SESSION:  PT End of Session - 03/31/23 1515     Visit Number 3    Date for PT Re-Evaluation 05/12/23    Authorization Type [place] Medicaid $4    Authorization Time Period 03/17/23-05/15/23    Authorization - Number of Visits 5    PT Start Time 1516    PT Stop Time 1555    PT Time Calculation (min) 39 min    Activity Tolerance Patient tolerated treatment well    Behavior During Therapy WFL for tasks assessed/performed              Past Medical History:  Diagnosis Date   Heart palpitations    Past Surgical History:  Procedure Laterality Date   CESAREAN SECTION     There are no problems to display for this patient.   PCP: [facility]   REFERRING PROVIDER: Suanne Marker, MD   REFERRING DIAG: M54.42,M54.41,G89.29 (ICD-10-CM) - Chronic bilateral low back pain with bilateral sciatica   Rationale for Evaluation and Treatment: Rehabilitation  THERAPY DIAG:  Chronic left-sided low back pain with left-sided sciatica  Muscle weakness (generalized)  Cramp and spasm  ONSET DATE: 3 years ago  SUBJECTIVE:                                                                                                                                                                                           SUBJECTIVE STATEMENT:   Feeling OK, after the dry needling I felt numbness and pain down the L arm and L leg for 3 days, mostly gone now but still having some mild numbness. Otherwise no changes. Started period the other day and having pain both sides of pelvis now.   PERTINENT HISTORY:  C-section x 3, pre-diabetes, obesity, chronic knee pain bil  PAIN:  Are you having pain? Yes: NPRS scale: 7/10 Pain location: lB sides of low back intermittent radiating down L foot to toes Pain description:  burning Aggravating factors: standing and cleaning all day  Relieving factors: pain medication - ibuprofen  PRECAUTIONS: None  RED FLAGS: None   WEIGHT BEARING RESTRICTIONS: No  FALLS:  Has patient fallen in last 6 months? No  LIVING ENVIRONMENT: Lives with: lives with their family Lives in: House/apartment Stairs: Yes: Internal: 14 steps; on left going up and External: 2 steps; none Has following equipment at home: None  OCCUPATION: homemaker  PLOF: Independent  PATIENT GOALS: get rid of pain  NEXT MD VISIT: not scheduled yet  OBJECTIVE:   DIAGNOSTIC FINDINGS:  03/16/23 XR Spine Lumbar IMPRESSION: Mild degenerative disc disease L4-5. Slight lumbar curvature, convexity to the left. No acute abnormality.   PATIENT SURVEYS:  Modified Oswestry 13/50 = 26% moderate disability   COGNITION: Overall cognitive status: Within functional limits for tasks assessed     SENSATION: Light touch: WFL  MUSCLE LENGTH: Hamstrings: Right 90 deg; Left 90 deg Quadriceps: Moderated tightness bil, measured in prone, increased LBP and burning with knee bend  POSTURE: rounded shoulders  PALPATION: Tenderness with PA mobs L3-5, L erector spinae, QL, L SIJ, L glute med and piriformis.   LUMBAR ROM:   03/17/23- all movements provoked pain on Left low back with increased radiation to LLE, but worse with extension, L SB and L rotation.   AROM eval  Flexion To ankles, pain L low back  Extension Limited 50%, pain L low back  Right lateral flexion To knee, pain  Left lateral flexion To knee, p!  Right rotation WNL, pain  Left rotation WNL, p!   (Blank rows = not tested)    LOWER EXTREMITY ROM:   tightness bil hips but symmetric and WFL   LOWER EXTREMITY MMT:    MMT Left Eval (seated) Right Eval (Seated)  Hip flexion 4+p! 5  Hip extension 4 prone 4 prone  Hip abduction 5 5  Hip adduction 4+ 4+  Knee flexion 5 5  Knee extension 5 5  Ankle dorsiflexion 5 WB 5 WB  Ankle  plantarflexion 5 WB 5 WB   (Blank rows = not tested)  LUMBAR SPECIAL TESTS:  Straight leg raise test: Negative, FABER test: Negative, and Long sit test: Negative  FUNCTIONAL TESTS:  5 times sit to stand: 17 seconds, no UE assist, table height raised to put legs in 90/90 position to accommodate height.   GAIT: Distance walked: 84' Assistive device utilized: None Level of assistance: Complete Independence Comments: no significant deviation  TODAY'S TREATMENT:                                                                                                                              DATE:    03/31/23  TherEx  Nustep L4x6 minutes BLEs only for w/u and tissue perfusion Supine TA sets 15x3 seconds Lumbar rotation stretch 5x3 seconds B SKTC 5x3 seconds B  PPT 10x3 second holds Lumbar arching supine 10x3 second holds PPT + march x10 B Bridge + ABD into red TB x12  Sidelying hip ABD red TB x12 B Figure 4 piriformis stretch 2x30 seconds B  HS stretch 2x30 seconds B  QL stretch forward only 3x30 seconds Standing hip hikes + TA set x12 B TA set + standing march x12 B     03/24/23 Therapeutic Exercise: to improve strength and mobility.  Demo, verbal and tactile cues throughout for technique. Nustep L5 x 6 min  Seated sciatic nerve glide LTR x 10 PPT x 10 PPT with hold and diaphragmatic  breathingk x 10  SKTC x 3 Sciatic nerve glide x 10 R/L KTOS stretch x 3 Supine PPT with pillow squeeze x 10 with 5 sec hold Manual Therapy: to decrease muscle spasm and pain and improve mobility STM/TPR to L lumbar paraspinals, L UPA mobs to lumbar spine grade 3-4, skilled palpation and monitoring during dry needling. IASTM with foam roller to L glutes, QL, erector spinae Trigger Point Dry-Needling  Treatment instructions: Expect mild to moderate muscle soreness. S/S of pneumothorax if dry needled over a lung field, and to seek immediate medical attention should they occur. Patient verbalized  understanding of these instructions and education. Patient Consent Given: Yes Education handout provided: Yes Muscles treated: L lumbar multifidi L3-5, L glut med/max, L piriformis Electrical stimulation performed: No Parameters: N/A Treatment response/outcome: Twitch Response Elicited and Palpable Increase in Muscle Length   03/17/23 Eval only     PATIENT EDUCATION:  Education details: TrDN, HEP Person educated: Patient Education method: Explanation, Demonstration, Verbal cues, and Handouts Education comprehension: verbalized understanding and returned demonstration  HOME EXERCISE PROGRAM: Access Code: [email or web address] URL: [email or web address] Date: 03/24/2023 Prepared by: Harrie Foreman  Exercises - Supine Posterior Pelvic Tilt with Knee Rocks  - 1 x daily - 7 x weekly - 3 sets - 10 reps - Supine Posterior Pelvic Tilt  - 1 x daily - 7 x weekly - 1 sets - 10 reps -   hold - Posterior Pelvic Tilt with Adduction Using Pillow in Hooklying  - 1 x daily - 7 x weekly - 3 sets - 10 reps - 5- 10 sec  hold - Hooklying Single Knee to Chest Stretch  - 1 x daily - 7 x weekly - 1 sets - 5 reps - 15 sec  hold - Supine Sciatic Nerve Glide  - 1 x daily - 7 x weekly - 1 sets - 10 reps - Supine Piriformis Stretch with Foot on Ground  - 1 x daily - 7 x weekly - 1 sets - 3 reps - 30 sec hold  ASSESSMENT:  CLINICAL IMPRESSION:     Pt arrives today doing OK, sounds like she had an adverse reaction to dry needling last visit- will hold off on this for now. Focused on core strengthening and lumbar mobility as appropriate and tolerated today, will continue to progress as able and tolerated.   OBJECTIVE IMPAIRMENTS: decreased activity tolerance, decreased endurance, difficulty walking, decreased ROM, decreased strength, hypomobility, increased fascial restrictions, increased muscle spasms, impaired flexibility, and pain.   ACTIVITY LIMITATIONS: carrying, lifting, bending, standing,  sleeping, locomotion level, and caring for others  PARTICIPATION LIMITATIONS: meal prep, cleaning, laundry, occupation, and yard work  PERSONAL FACTORS: Time since onset of injury/illness/exacerbation and 1-2 comorbidities: history of C-section  are also affecting patient's functional outcome.   REHAB POTENTIAL: Good  CLINICAL DECISION MAKING: Stable/uncomplicated  EVALUATION COMPLEXITY: Low   GOALS: Goals reviewed with patient? Yes  SHORT TERM GOALS: Target date: 03/31/2023   Patient will be independent with initial HEP.  Baseline: needs Goal status: IN PROGRESS 03/24/23 given  2.  Complete FOTO within 2 visits Baseline:  Goal status: IN PROGRESS    LONG TERM GOALS: Target date: 05/12/2023   Patient will be independent with advanced/ongoing HEP to improve outcomes and carryover.  Baseline:  Goal status: IN PROGRESS  2.  Patient will report 75% improvement in low back pain to improve QOL.  Baseline:  Goal status: IN PROGRESS  3.  Patient will demonstrate full pain free lumbar ROM to  perform ADLs.   Baseline: see objective Goal status: IN PROGRESS  4. Patient will report at least 6 points improvement on modified Oswestry to demonstrate improved functional ability.  Baseline: 13/50 Goal status: IN PROGRESS   5.   Patient will report centralization of radicular symptoms.  Baseline: radiates to L foot Goal status: IN PROGRESS  6. Patient will report expected outcome on FOTO Baseline: TBD Goal status: IN PROGRESS   PLAN:  PT FREQUENCY: 1-2x/week  PT DURATION: 8 weeks  PLANNED INTERVENTIONS: 97110-Therapeutic exercises, 97530- Therapeutic activity, 97112- Neuromuscular re-education, 97535- Self Care, 91478- Manual therapy, 97760- Orthotic Fit/training, 97014- Electrical stimulation (unattended), [id number]- Electrical stimulation (manual), [id number]- Ultrasound, [id number]- Traction (mechanical), Balance training, Stair training, Taping, Dry Needling, Joint mobilization, Joint  manipulation, Spinal manipulation, Spinal mobilization, Cryotherapy, and Moist heat  PLAN FOR NEXT SESSION: HEP for core strengthening, flexion preference no more dry needling for now- had increased pain and numbness after first DN session   Nedra Hai, PT, DPT 03/31/23 3:56 PM

## 2023-04-07 ENCOUNTER — Ambulatory Visit: Payer: Medicaid Other | Admitting: Physical Therapy

## 2023-04-07 ENCOUNTER — Encounter: Payer: Self-pay | Admitting: Physical Therapy

## 2023-04-07 DIAGNOSIS — R252 Cramp and spasm: Secondary | ICD-10-CM

## 2023-04-07 DIAGNOSIS — G8929 Other chronic pain: Secondary | ICD-10-CM

## 2023-04-07 DIAGNOSIS — M6281 Muscle weakness (generalized): Secondary | ICD-10-CM

## 2023-04-07 DIAGNOSIS — M5442 Lumbago with sciatica, left side: Secondary | ICD-10-CM | POA: Diagnosis not present

## 2023-04-07 NOTE — Therapy (Signed)
OUTPATIENT PHYSICAL THERAPY TREATMENT   Patient Name: Anna Saunders MRN: [id number] DOB:06/09/1975, 47 y.o., female Today's Date: 04/07/2023  END OF SESSION:  PT End of Session - 04/07/23 1023     Visit Number 4    Date for PT Re-Evaluation 05/12/23    Authorization Type [place] Medicaid $4    Authorization Time Period 03/17/23-05/15/23    Authorization - Visit Number 2    Authorization - Number of Visits 5    PT Start Time 1021    PT Stop Time 1100    PT Time Calculation (min) 39 min    Activity Tolerance Patient tolerated treatment well    Behavior During Therapy WFL for tasks assessed/performed              Past Medical History:  Diagnosis Date   Heart palpitations    Past Surgical History:  Procedure Laterality Date   CESAREAN SECTION     There are no problems to display for this patient.   PCP: [facility]   REFERRING PROVIDER: Suanne Marker, MD   REFERRING DIAG: M54.42,M54.41,G89.29 (ICD-10-CM) - Chronic bilateral low back pain with bilateral sciatica   Rationale for Evaluation and Treatment: Rehabilitation  THERAPY DIAG:  Chronic left-sided low back pain with left-sided sciatica  Muscle weakness (generalized)  Cramp and spasm  ONSET DATE: 3 years ago  SUBJECTIVE:                                                                                                                                                                                           SUBJECTIVE STATEMENT:  Pain is so much better.  No pain or burning down L leg, centralized to Left side low back  PERTINENT HISTORY:  C-section x 3, pre-diabetes, obesity, chronic knee pain bil  PAIN:  Are you having pain? Yes: NPRS scale: 4/10 Pain location: L side of low back Pain description: poking, stabbing Aggravating factors: standing and cleaning all day  Relieving factors: pain medication - ibuprofen  PRECAUTIONS: None  RED FLAGS: None   WEIGHT  BEARING RESTRICTIONS: No  FALLS:  Has patient fallen in last 6 months? No  LIVING ENVIRONMENT: Lives with: lives with their family Lives in: House/apartment Stairs: Yes: Internal: 14 steps; on left going up and External: 2 steps; none Has following equipment at home: None  OCCUPATION: homemaker  PLOF: Independent  PATIENT GOALS: get rid of pain  NEXT MD VISIT: not scheduled yet  OBJECTIVE:   DIAGNOSTIC FINDINGS:  03/16/23 XR Spine Lumbar IMPRESSION: Mild degenerative disc disease L4-5. Slight lumbar curvature, convexity to the left. No acute abnormality.  PATIENT SURVEYS:  Modified Oswestry 13/50 = 26% moderate disability   COGNITION: Overall cognitive status: Within functional limits for tasks assessed     SENSATION: Light touch: WFL  MUSCLE LENGTH: Hamstrings: Right 90 deg; Left 90 deg Quadriceps: Moderated tightness bil, measured in prone, increased LBP and burning with knee bend  POSTURE: rounded shoulders  PALPATION: Tenderness with PA mobs L3-5, L erector spinae, QL, L SIJ, L glute med and piriformis.   LUMBAR ROM:   03/17/23- all movements provoked pain on Left low back with increased radiation to LLE, but worse with extension, L SB and L rotation.   AROM eval  Flexion To ankles, pain L low back  Extension Limited 50%, pain L low back  Right lateral flexion To knee, pain  Left lateral flexion To knee, p!  Right rotation WNL, pain  Left rotation WNL, p!   (Blank rows = not tested)    LOWER EXTREMITY ROM:   tightness bil hips but symmetric and WFL   LOWER EXTREMITY MMT:    MMT Left Eval (seated) Right Eval (Seated)  Hip flexion 4+p! 5  Hip extension 4 prone 4 prone  Hip abduction 5 5  Hip adduction 4+ 4+  Knee flexion 5 5  Knee extension 5 5  Ankle dorsiflexion 5 WB 5 WB  Ankle plantarflexion 5 WB 5 WB   (Blank rows = not tested)  LUMBAR SPECIAL TESTS:  Straight leg raise test: Negative, FABER test: Negative, and Long sit test:  Negative  FUNCTIONAL TESTS:  5 times sit to stand: 17 seconds, no UE assist, table height raised to put legs in 90/90 position to accommodate height.   GAIT: Distance walked: 46' Assistive device utilized: None Level of assistance: Complete Independence Comments: no significant deviation  TODAY'S TREATMENT:                                                                                                                              DATE:   04/07/2023 Therapeutic Exercise: to improve strength and mobility.  Demo, verbal and tactile cues throughout for technique. Bike L2 x 6 min  LTR stretch SKTC stretch Sciatic nerve stretch PPT  Supine march with TrA contraction Hip adduction squeezes with TrA contraction.  Bridge with ball squeeze 2 x 10  Manual Therapy: to decrease muscle spasm and pain and improve mobility L UPA mobs lumbar spine grade 2-3, IASTM with foam roller to L glutes and QL, STM/TPR to L lumbar erector spinea and QL  03/31/23 TherEx  Nustep L4x6 minutes BLEs only for w/u and tissue perfusion Supine TA sets 15x3 seconds Lumbar rotation stretch 5x3 seconds B SKTC 5x3 seconds B  PPT 10x3 second holds Lumbar arching supine 10x3 second holds PPT + march x10 B Bridge + ABD into red TB x12  Sidelying hip ABD red TB x12 B Figure 4 piriformis stretch 2x30 seconds B  HS stretch 2x30 seconds B  QL stretch forward only 3x30  seconds Standing hip hikes + TA set x12 B TA set + standing march x12 B     03/24/23 Therapeutic Exercise: to improve strength and mobility.  Demo, verbal and tactile cues throughout for technique. Nustep L5 x 6 min  Seated sciatic nerve glide LTR x 10 PPT x 10 PPT with hold and diaphragmatic breathingk x 10  SKTC x 3 Sciatic nerve glide x 10 R/L KTOS stretch x 3 Supine PPT with pillow squeeze x 10 with 5 sec hold Manual Therapy: to decrease muscle spasm and pain and improve mobility STM/TPR to L lumbar paraspinals, L UPA mobs to lumbar spine  grade 3-4, skilled palpation and monitoring during dry needling. IASTM with foam roller to L glutes, QL, erector spinae Trigger Point Dry-Needling  Treatment instructions: Expect mild to moderate muscle soreness. S/S of pneumothorax if dry needled over a lung field, and to seek immediate medical attention should they occur. Patient verbalized understanding of these instructions and education. Patient Consent Given: Yes Education handout provided: Yes Muscles treated: L lumbar multifidi L3-5, L glut med/max, L piriformis Electrical stimulation performed: No Parameters: N/A Treatment response/outcome: Twitch Response Elicited and Palpable Increase in Muscle Length   03/17/23 Eval only     PATIENT EDUCATION:  Education details: TrDN, HEP Person educated: Patient Education method: Explanation, Demonstration, Verbal cues, and Handouts Education comprehension: verbalized understanding and returned demonstration  HOME EXERCISE PROGRAM: Access Code: [email or web address] URL: [email or web address] Date: 03/24/2023 Prepared by: Harrie Foreman  Exercises - Supine Posterior Pelvic Tilt with Knee Rocks  - 1 x daily - 7 x weekly - 3 sets - 10 reps - Supine Posterior Pelvic Tilt  - 1 x daily - 7 x weekly - 1 sets - 10 reps -   hold - Posterior Pelvic Tilt with Adduction Using Pillow in Hooklying  - 1 x daily - 7 x weekly - 3 sets - 10 reps - 5- 10 sec  hold - Hooklying Single Knee to Chest Stretch  - 1 x daily - 7 x weekly - 1 sets - 5 reps - 15 sec  hold - Supine Sciatic Nerve Glide  - 1 x daily - 7 x weekly - 1 sets - 10 reps - Supine Piriformis Stretch with Foot on Ground  - 1 x daily - 7 x weekly - 1 sets - 3 reps - 30 sec hold  ASSESSMENT:  CLINICAL IMPRESSION: Anna Saunders reports significant improvement in pain and radicular symptoms today, pain has centralized to L low back.  Tolerated exercises well without pain, and reported decreased pain and spasm after manual therapy.   Anna Saunders continues to demonstrate potential for improvement and would benefit from continued skilled therapy to address impairments.      OBJECTIVE IMPAIRMENTS: decreased activity tolerance, decreased endurance, difficulty walking, decreased ROM, decreased strength, hypomobility, increased fascial restrictions, increased muscle spasms, impaired flexibility, and pain.   ACTIVITY LIMITATIONS: carrying, lifting, bending, standing, sleeping, locomotion level, and caring for others  PARTICIPATION LIMITATIONS: meal prep, cleaning, laundry, occupation, and yard work  PERSONAL FACTORS: Time since onset of injury/illness/exacerbation and 1-2 comorbidities: history of C-section  are also affecting patient's functional outcome.   REHAB POTENTIAL: Good  CLINICAL DECISION MAKING: Stable/uncomplicated  EVALUATION COMPLEXITY: Low   GOALS: Goals reviewed with patient? Yes  SHORT TERM GOALS: Target date: 03/31/2023   Patient will be independent with initial HEP.  Baseline: needs Goal status: MET 03/24/23 given 04/07/23- met  2.  Complete FOTO within 2 visits Baseline:  Goal status: NOT MET    LONG TERM GOALS: Target date: 05/12/2023   Patient will be independent with advanced/ongoing HEP to improve outcomes and carryover.  Baseline:  Goal status: IN PROGRESS  2.  Patient will report 75% improvement in low back pain to improve QOL.  Baseline:  Goal status: IN PROGRESS  3.  Patient will demonstrate full pain free lumbar ROM to perform ADLs.   Baseline: see objective Goal status: IN PROGRESS  4. Patient will report at least 6 points improvement on modified Oswestry to demonstrate improved functional ability.  Baseline: 13/50 Goal status: IN PROGRESS   5.   Patient will report centralization of radicular symptoms.  Baseline: radiates to L foot Goal status: IN PROGRESS  6. Patient will report expected outcome on FOTO Baseline: TBD Goal status: IN PROGRESS   PLAN:  PT  FREQUENCY: 1-2x/week  PT DURATION: 8 weeks  PLANNED INTERVENTIONS: 97110-Therapeutic exercises, 97530- Therapeutic activity, [id number]- Neuromuscular re-education, 97535- Self Care, [id number]- Manual therapy, 97760- Orthotic Fit/training, 97014- Electrical stimulation (unattended), [id number]- Electrical stimulation (manual), [id number]- Ultrasound, [id number]- Traction (mechanical), Balance training, Stair training, Taping, Dry Needling, Joint mobilization, Joint manipulation, Spinal manipulation, Spinal mobilization, Cryotherapy, and Moist heat  PLAN FOR NEXT SESSION: HEP for core strengthening, flexion preference no more dry needling for now- had increased pain and numbness after first DN session   Jena Gauss, PT, DPT  04/07/23 11:12 AM

## 2023-04-14 ENCOUNTER — Ambulatory Visit: Payer: Medicaid Other

## 2023-04-14 DIAGNOSIS — R252 Cramp and spasm: Secondary | ICD-10-CM

## 2023-04-14 DIAGNOSIS — G8929 Other chronic pain: Secondary | ICD-10-CM

## 2023-04-14 DIAGNOSIS — M6281 Muscle weakness (generalized): Secondary | ICD-10-CM

## 2023-04-14 DIAGNOSIS — M5442 Lumbago with sciatica, left side: Secondary | ICD-10-CM | POA: Diagnosis not present

## 2023-04-14 NOTE — Therapy (Signed)
OUTPATIENT PHYSICAL THERAPY TREATMENT   Patient Name: Anna Saunders MRN: [id number] DOB:01-30-1976, 47 y.o., female Today's Date: 04/14/2023  END OF SESSION:  PT End of Session - 04/14/23 1446     Visit Number 5    Date for PT Re-Evaluation 05/12/23    Authorization Type [place] Medicaid $4    Authorization Time Period 03/17/23-05/15/23    Authorization - Number of Visits 5    PT Start Time 1403    PT Stop Time 1442    PT Time Calculation (min) 39 min    Activity Tolerance Patient tolerated treatment well    Behavior During Therapy WFL for tasks assessed/performed               Past Medical History:  Diagnosis Date   Heart palpitations    Past Surgical History:  Procedure Laterality Date   CESAREAN SECTION     There are no problems to display for this patient.   PCP: [facility]   REFERRING PROVIDER: Suanne Marker, MD   REFERRING DIAG: M54.42,M54.41,G89.29 (ICD-10-CM) - Chronic bilateral low back pain with bilateral sciatica   Rationale for Evaluation and Treatment: Rehabilitation  THERAPY DIAG:  Chronic left-sided low back pain with left-sided sciatica  Muscle weakness (generalized)  Cramp and spasm  ONSET DATE: 3 years ago  SUBJECTIVE:                                                                                                                                                                                           SUBJECTIVE STATEMENT: Pt reports improved pain.   PERTINENT HISTORY:  C-section x 3, pre-diabetes, obesity, chronic knee pain bil  PAIN:  Are you having pain? Yes: NPRS scale: 3/10 Pain location: L side of low back Pain description: poking, stabbing Aggravating factors: standing and cleaning all day  Relieving factors: pain medication - ibuprofen  PRECAUTIONS: None  RED FLAGS: None   WEIGHT BEARING RESTRICTIONS: No  FALLS:  Has patient fallen in last 6 months? No  LIVING  ENVIRONMENT: Lives with: lives with their family Lives in: House/apartment Stairs: Yes: Internal: 14 steps; on left going up and External: 2 steps; none Has following equipment at home: None  OCCUPATION: homemaker  PLOF: Independent  PATIENT GOALS: get rid of pain  NEXT MD VISIT: not scheduled yet  OBJECTIVE:   DIAGNOSTIC FINDINGS:  03/16/23 XR Spine Lumbar IMPRESSION: Mild degenerative disc disease L4-5. Slight lumbar curvature, convexity to the left. No acute abnormality.   PATIENT SURVEYS:  Modified Oswestry 13/50 = 26% moderate disability   COGNITION: Overall cognitive status: Within functional limits for  tasks assessed     SENSATION: Light touch: WFL  MUSCLE LENGTH: Hamstrings: Right 90 deg; Left 90 deg Quadriceps: Moderated tightness bil, measured in prone, increased LBP and burning with knee bend  POSTURE: rounded shoulders  PALPATION: Tenderness with PA mobs L3-5, L erector spinae, QL, L SIJ, L glute med and piriformis.   LUMBAR ROM:   03/17/23- all movements provoked pain on Left low back with increased radiation to LLE, but worse with extension, L SB and L rotation.   AROM eval  Flexion To ankles, pain L low back  Extension Limited 50%, pain L low back  Right lateral flexion To knee, pain  Left lateral flexion To knee, p!  Right rotation WNL, pain  Left rotation WNL, p!   (Blank rows = not tested)    LOWER EXTREMITY ROM:   tightness bil hips but symmetric and WFL   LOWER EXTREMITY MMT:    MMT Left Eval (seated) Right Eval (Seated)  Hip flexion 4+p! 5  Hip extension 4 prone 4 prone  Hip abduction 5 5  Hip adduction 4+ 4+  Knee flexion 5 5  Knee extension 5 5  Ankle dorsiflexion 5 WB 5 WB  Ankle plantarflexion 5 WB 5 WB   (Blank rows = not tested)  LUMBAR SPECIAL TESTS:  Straight leg raise test: Negative, FABER test: Negative, and Long sit test: Negative  FUNCTIONAL TESTS:  5 times sit to stand: 17 seconds, no UE assist, table height  raised to put legs in 90/90 position to accommodate height.   GAIT: Distance walked: 70' Assistive device utilized: None Level of assistance: Complete Independence Comments: no significant deviation  TODAY'S TREATMENT:                                                                                                                              DATE:  04/14/23 Therapeutic Exercise: to improve strength and mobility.  Demo, verbal and tactile cues throughout for technique. Bike L2x30min Standing heel raises 2x10 Standing hip abduction x 10 BLE Standing hip extension x 10 BLE Seated shoulder flexion yellow wt ball x 10; blue x 10  Seated chest press blue wt ball 2x10 Seated side to side with blue wt ball 2 x 10  Seated lumbar flexion stretch 3x30 green pball  Manual Therapy: to decrease muscle spasm and pain and improve mobility IASTM with foam roller to L glutes and QL, STM/TPR to L QL  04/07/2023 Therapeutic Exercise: to improve strength and mobility.  Demo, verbal and tactile cues throughout for technique. Bike L2 x 6 min  LTR stretch SKTC stretch Sciatic nerve stretch PPT  Supine march with TrA contraction Hip adduction squeezes with TrA contraction.  Bridge with ball squeeze 2 x 10  Manual Therapy: to decrease muscle spasm and pain and improve mobility L UPA mobs lumbar spine grade 2-3, IASTM with foam roller to L glutes and QL, STM/TPR to L lumbar erector spinea and QL  03/31/23 TherEx  Nustep L4x6 minutes BLEs only for w/u and tissue perfusion Supine TA sets 15x3 seconds Lumbar rotation stretch 5x3 seconds B SKTC 5x3 seconds B  PPT 10x3 second holds Lumbar arching supine 10x3 second holds PPT + march x10 B Bridge + ABD into red TB x12  Sidelying hip ABD red TB x12 B Figure 4 piriformis stretch 2x30 seconds B  HS stretch 2x30 seconds B  QL stretch forward only 3x30 seconds Standing hip hikes + TA set x12 B TA set + standing march x12 B     03/24/23 Therapeutic  Exercise: to improve strength and mobility.  Demo, verbal and tactile cues throughout for technique. Nustep L5 x 6 min  Seated sciatic nerve glide LTR x 10 PPT x 10 PPT with hold and diaphragmatic breathingk x 10  SKTC x 3 Sciatic nerve glide x 10 R/L KTOS stretch x 3 Supine PPT with pillow squeeze x 10 with 5 sec hold Manual Therapy: to decrease muscle spasm and pain and improve mobility STM/TPR to L lumbar paraspinals, L UPA mobs to lumbar spine grade 3-4, skilled palpation and monitoring during dry needling. IASTM with foam roller to L glutes, QL, erector spinae Trigger Point Dry-Needling  Treatment instructions: Expect mild to moderate muscle soreness. S/S of pneumothorax if dry needled over a lung field, and to seek immediate medical attention should they occur. Patient verbalized understanding of these instructions and education. Patient Consent Given: Yes Education handout provided: Yes Muscles treated: L lumbar multifidi L3-5, L glut med/max, L piriformis Electrical stimulation performed: No Parameters: N/A Treatment response/outcome: Twitch Response Elicited and Palpable Increase in Muscle Length   03/17/23 Eval only     PATIENT EDUCATION:  Education details: TrDN, HEP Person educated: Patient Education method: Explanation, Demonstration, Verbal cues, and Handouts Education comprehension: verbalized understanding and returned demonstration  HOME EXERCISE PROGRAM: Access Code: [email or web address] URL: [email or web address] Date: 03/24/2023 Prepared by: Harrie Foreman  Exercises - Supine Posterior Pelvic Tilt with Knee Rocks  - 1 x daily - 7 x weekly - 3 sets - 10 reps - Supine Posterior Pelvic Tilt  - 1 x daily - 7 x weekly - 1 sets - 10 reps -   hold - Posterior Pelvic Tilt with Adduction Using Pillow in Hooklying  - 1 x daily - 7 x weekly - 3 sets - 10 reps - 5- 10 sec  hold - Hooklying Single Knee to Chest Stretch  - 1 x daily - 7 x weekly - 1 sets - 5 reps -  15 sec  hold - Supine Sciatic Nerve Glide  - 1 x daily - 7 x weekly - 1 sets - 10 reps - Supine Piriformis Stretch with Foot on Ground  - 1 x daily - 7 x weekly - 1 sets - 3 reps - 30 sec hold  ASSESSMENT:  CLINICAL IMPRESSION: Pt shows good tolerance for exercises today. Focused on lumbar strengthening to improve stability for spine. Mild cues required throughout session to target the correct muscles and prevent compensation. Olie A Radman continues to demonstrate potential for improvement and would benefit from continued skilled therapy to address impairments.      OBJECTIVE IMPAIRMENTS: decreased activity tolerance, decreased endurance, difficulty walking, decreased ROM, decreased strength, hypomobility, increased fascial restrictions, increased muscle spasms, impaired flexibility, and pain.   ACTIVITY LIMITATIONS: carrying, lifting, bending, standing, sleeping, locomotion level, and caring for others  PARTICIPATION LIMITATIONS: meal prep, cleaning, laundry, occupation, and yard work  PERSONAL FACTORS: Time since onset of  injury/illness/exacerbation and 1-2 comorbidities: history of C-section  are also affecting patient's functional outcome.   REHAB POTENTIAL: Good  CLINICAL DECISION MAKING: Stable/uncomplicated  EVALUATION COMPLEXITY: Low   GOALS: Goals reviewed with patient? Yes  SHORT TERM GOALS: Target date: 03/31/2023   Patient will be independent with initial HEP.  Baseline: needs Goal status: MET 03/24/23 given 04/07/23- met  2.  Complete FOTO within 2 visits Baseline:  Goal status: NOT MET    LONG TERM GOALS: Target date: 05/12/2023   Patient will be independent with advanced/ongoing HEP to improve outcomes and carryover.  Baseline:  Goal status: IN PROGRESS  2.  Patient will report 75% improvement in low back pain to improve QOL.  Baseline:  Goal status: IN PROGRESS  3.  Patient will demonstrate full pain free lumbar ROM to perform ADLs.   Baseline:  see objective Goal status: IN PROGRESS  4. Patient will report at least 6 points improvement on modified Oswestry to demonstrate improved functional ability.  Baseline: 13/50 Goal status: IN PROGRESS   5.   Patient will report centralization of radicular symptoms.  Baseline: radiates to L foot Goal status: IN PROGRESS  6. Patient will report expected outcome on FOTO Baseline: TBD Goal status: IN PROGRESS   PLAN:  PT FREQUENCY: 1-2x/week  PT DURATION: 8 weeks  PLANNED INTERVENTIONS: 97110-Therapeutic exercises, 97530- Therapeutic activity, 97112- Neuromuscular re-education, 97535- Self Care, 16109- Manual therapy, 97760- Orthotic Fit/training, 97014- Electrical stimulation (unattended), [id number]- Electrical stimulation (manual), [id number]- Ultrasound, [id number]- Traction (mechanical), Balance training, Stair training, Taping, Dry Needling, Joint mobilization, Joint manipulation, Spinal manipulation, Spinal mobilization, Cryotherapy, and Moist heat  PLAN FOR NEXT SESSION: HEP for core strengthening, flexion preference no more dry needling for now- had increased pain and numbness after first DN session   Darleene Cleaver, PTA 04/14/23 3:36 PM

## 2023-04-20 ENCOUNTER — Ambulatory Visit: Payer: Medicaid Other

## 2023-04-20 DIAGNOSIS — M5442 Lumbago with sciatica, left side: Secondary | ICD-10-CM | POA: Diagnosis not present

## 2023-04-20 DIAGNOSIS — R252 Cramp and spasm: Secondary | ICD-10-CM

## 2023-04-20 DIAGNOSIS — M6281 Muscle weakness (generalized): Secondary | ICD-10-CM

## 2023-04-20 DIAGNOSIS — G8929 Other chronic pain: Secondary | ICD-10-CM

## 2023-04-20 NOTE — Therapy (Signed)
OUTPATIENT PHYSICAL THERAPY TREATMENT   Patient Name: JAIDALYNN ACCURSO MRN: [id number] DOB:Dec 02, 1975, 47 y.o., female Today's Date: 04/20/2023  END OF SESSION:  PT End of Session - 04/20/23 1101     Visit Number 6    Date for PT Re-Evaluation 05/12/23    Authorization Type [place] Medicaid $4    Authorization Time Period 03/17/23-05/15/23    Authorization - Visit Number 5    Authorization - Number of Visits 5    PT Start Time 1018    PT Stop Time 1059    PT Time Calculation (min) 41 min    Activity Tolerance Patient tolerated treatment well    Behavior During Therapy WFL for tasks assessed/performed                Past Medical History:  Diagnosis Date   Heart palpitations    Past Surgical History:  Procedure Laterality Date   CESAREAN SECTION     There are no problems to display for this patient.   PCP: Suanne Marker, MD   REFERRING PROVIDER: Suanne Marker, MD   REFERRING DIAG: M54.42,M54.41,G89.29 (ICD-10-CM) - Chronic bilateral low back pain with bilateral sciatica   Rationale for Evaluation and Treatment: Rehabilitation  THERAPY DIAG:  Chronic left-sided low back pain with left-sided sciatica  Muscle weakness (generalized)  Cramp and spasm  ONSET DATE: 3 years ago  SUBJECTIVE:                                                                                                                                                                                           SUBJECTIVE STATEMENT: Pt reports she had some increased pain on Monday no known reason. It's better today  PERTINENT HISTORY:  C-section x 3, pre-diabetes, obesity, chronic knee pain bil  PAIN:  Are you having pain? Yes: NPRS scale: 4/10 Pain location: L side of low back Pain description: poking, stabbing Aggravating factors: standing and cleaning all day  Relieving factors: pain medication - ibuprofen  PRECAUTIONS: None  RED FLAGS: None   WEIGHT BEARING RESTRICTIONS:  No  FALLS:  Has patient fallen in last 6 months? No  LIVING ENVIRONMENT: Lives with: lives with their family Lives in: House/apartment Stairs: Yes: Internal: 14 steps; on left going up and External: 2 steps; none Has following equipment at home: None  OCCUPATION: homemaker  PLOF: Independent  PATIENT GOALS: get rid of pain  NEXT MD VISIT: not scheduled yet  OBJECTIVE:   DIAGNOSTIC FINDINGS:  03/16/23 XR Spine Lumbar IMPRESSION: Mild degenerative disc disease L4-5. Slight lumbar curvature, convexity to the left. No acute abnormality.   PATIENT SURVEYS:  Modified Oswestry 13/50 = 26% moderate disability   COGNITION: Overall cognitive status: Within functional limits for tasks assessed     SENSATION: Light touch: WFL  MUSCLE LENGTH: Hamstrings: Right 90 deg; Left 90 deg Quadriceps: Moderated tightness bil, measured in prone, increased LBP and burning with knee bend  POSTURE: rounded shoulders  PALPATION: Tenderness with PA mobs L3-5, L erector spinae, QL, L SIJ, L glute med and piriformis.   LUMBAR ROM:   03/17/23- all movements provoked pain on Left low back with increased radiation to LLE, but worse with extension, L SB and L rotation.   AROM eval  Flexion To ankles, pain L low back  Extension Limited 50%, pain L low back  Right lateral flexion To knee, pain  Left lateral flexion To knee, p!  Right rotation WNL, pain  Left rotation WNL, p!   (Blank rows = not tested)    LOWER EXTREMITY ROM:   tightness bil hips but symmetric and WFL   LOWER EXTREMITY MMT:    MMT Left Eval (seated) Right Eval (Seated)  Hip flexion 4+p! 5  Hip extension 4 prone 4 prone  Hip abduction 5 5  Hip adduction 4+ 4+  Knee flexion 5 5  Knee extension 5 5  Ankle dorsiflexion 5 WB 5 WB  Ankle plantarflexion 5 WB 5 WB   (Blank rows = not tested)  LUMBAR SPECIAL TESTS:  Straight leg raise test: Negative, FABER test: Negative, and Long sit test: Negative  FUNCTIONAL TESTS:   5 times sit to stand: 17 seconds, no UE assist, table height raised to put legs in 90/90 position to accommodate height.   GAIT: Distance walked: 62' Assistive device utilized: None Level of assistance: Complete Independence Comments: no significant deviation  TODAY'S TREATMENT:                                                                                                                              DATE:  04/14/23 Therapeutic Exercise: to improve strength and mobility.  Demo, verbal and tactile cues throughout for technique. Bike L5x55min Seated L sciatic nerve glide x 10  Good mornings with wand behind back x 5 Seated lumbar flexion stretch with green pball 3x30" SKTC stretch 2x30" BLE LTR 5x10" both sides Supine posterior pelvic tilt 2x10 with 3 sec hold March + TrA GTB x 10 B Clamshells GTB x 10 B Standing marching x 10 B  04/14/23 Therapeutic Exercise: to improve strength and mobility.  Demo, verbal and tactile cues throughout for technique. Bike L2x62min Standing heel raises 2x10 Standing hip abduction x 10 BLE Standing hip extension x 10 BLE Seated shoulder flexion yellow wt ball x 10; blue x 10  Seated chest press blue wt ball 2x10 Seated side to side with blue wt ball 2 x 10  Seated lumbar flexion stretch 3x30 green pball  Manual Therapy: to decrease muscle spasm and pain and improve mobility IASTM with foam roller to L glutes  and QL, STM/TPR to L QL  04/07/2023 Therapeutic Exercise: to improve strength and mobility.  Demo, verbal and tactile cues throughout for technique. Bike L2 x 6 min  LTR stretch SKTC stretch Sciatic nerve stretch PPT  Supine march with TrA contraction Hip adduction squeezes with TrA contraction.  Bridge with ball squeeze 2 x 10  Manual Therapy: to decrease muscle spasm and pain and improve mobility L UPA mobs lumbar spine grade 2-3, IASTM with foam roller to L glutes and QL, STM/TPR to L lumbar erector spinea and  QL  03/31/23 TherEx  Nustep L4x6 minutes BLEs only for w/u and tissue perfusion Supine TA sets 15x3 seconds Lumbar rotation stretch 5x3 seconds B SKTC 5x3 seconds B  PPT 10x3 second holds Lumbar arching supine 10x3 second holds PPT + march x10 B Bridge + ABD into red TB x12  Sidelying hip ABD red TB x12 B Figure 4 piriformis stretch 2x30 seconds B  HS stretch 2x30 seconds B  QL stretch forward only 3x30 seconds Standing hip hikes + TA set x12 B TA set + standing march x12 B     03/24/23 Therapeutic Exercise: to improve strength and mobility.  Demo, verbal and tactile cues throughout for technique. Nustep L5 x 6 min  Seated sciatic nerve glide LTR x 10 PPT x 10 PPT with hold and diaphragmatic breathingk x 10  SKTC x 3 Sciatic nerve glide x 10 R/L KTOS stretch x 3 Supine PPT with pillow squeeze x 10 with 5 sec hold Manual Therapy: to decrease muscle spasm and pain and improve mobility STM/TPR to L lumbar paraspinals, L UPA mobs to lumbar spine grade 3-4, skilled palpation and monitoring during dry needling. IASTM with foam roller to L glutes, QL, erector spinae Trigger Point Dry-Needling  Treatment instructions: Expect mild to moderate muscle soreness. S/S of pneumothorax if dry needled over a lung field, and to seek immediate medical attention should they occur. Patient verbalized understanding of these instructions and education. Patient Consent Given: Yes Education handout provided: Yes Muscles treated: L lumbar multifidi L3-5, L glut med/max, L piriformis Electrical stimulation performed: No Parameters: N/A Treatment response/outcome: Twitch Response Elicited and Palpable Increase in Muscle Length   03/17/23 Eval only     PATIENT EDUCATION:  Education details: TrDN, HEP Person educated: Patient Education method: Explanation, Demonstration, Verbal cues, and Handouts Education comprehension: verbalized understanding and returned demonstration  HOME EXERCISE  PROGRAM: Access Code: [email or web address] URL: [email or web address] Date: 03/24/2023 Prepared by: Harrie Foreman  Exercises - Supine Posterior Pelvic Tilt with Knee Rocks  - 1 x daily - 7 x weekly - 3 sets - 10 reps - Supine Posterior Pelvic Tilt  - 1 x daily - 7 x weekly - 1 sets - 10 reps -   hold - Posterior Pelvic Tilt with Adduction Using Pillow in Hooklying  - 1 x daily - 7 x weekly - 3 sets - 10 reps - 5- 10 sec  hold - Hooklying Single Knee to Chest Stretch  - 1 x daily - 7 x weekly - 1 sets - 5 reps - 15 sec  hold - Supine Sciatic Nerve Glide  - 1 x daily - 7 x weekly - 1 sets - 10 reps - Supine Piriformis Stretch with Foot on Ground  - 1 x daily - 7 x weekly - 1 sets - 3 reps - 30 sec hold  ASSESSMENT:  CLINICAL IMPRESSION: Pt continues to do well with lumbar flexion focused exercises. She shows good demonstration of  exercises with min cues required. She continues to have radicular pain intermittently. She continues to show decreased strength in hips, thus added more hip strengthening to HEP. Tariya A Pease continues to demonstrate potential for improvement and would benefit from continued skilled therapy to address impairments.      OBJECTIVE IMPAIRMENTS: decreased activity tolerance, decreased endurance, difficulty walking, decreased ROM, decreased strength, hypomobility, increased fascial restrictions, increased muscle spasms, impaired flexibility, and pain.   ACTIVITY LIMITATIONS: carrying, lifting, bending, standing, sleeping, locomotion level, and caring for others  PARTICIPATION LIMITATIONS: meal prep, cleaning, laundry, occupation, and yard work  PERSONAL FACTORS: Time since onset of injury/illness/exacerbation and 1-2 comorbidities: history of C-section  are also affecting patient's functional outcome.   REHAB POTENTIAL: Good  CLINICAL DECISION MAKING: Stable/uncomplicated  EVALUATION COMPLEXITY: Low   GOALS: Goals reviewed with patient? Yes  SHORT  TERM GOALS: Target date: 03/31/2023   Patient will be independent with initial HEP.  Baseline: needs Goal status: MET 03/24/23 given 04/07/23- met  2.  Complete FOTO within 2 visits Baseline:  Goal status: NOT MET    LONG TERM GOALS: Target date: 05/12/2023   Patient will be independent with advanced/ongoing HEP to improve outcomes and carryover.  Baseline:  Goal status: IN PROGRESS  2.  Patient will report 75% improvement in low back pain to improve QOL.  Baseline:  Goal status: IN PROGRESS  3.  Patient will demonstrate full pain free lumbar ROM to perform ADLs.   Baseline: see objective Goal status: IN PROGRESS  4. Patient will report at least 6 points improvement on modified Oswestry to demonstrate improved functional ability.  Baseline: 13/50 Goal status: IN PROGRESS   5.   Patient will report centralization of radicular symptoms.  Baseline: radiates to L foot Goal status: IN PROGRESS- 04/20/23- pt reports radicular pain that comes and goes  6. Patient will report expected outcome on FOTO Baseline: TBD Goal status: IN PROGRESS   PLAN:  PT FREQUENCY: 1-2x/week  PT DURATION: 8 weeks  PLANNED INTERVENTIONS: 97110-Therapeutic exercises, 97530- Therapeutic activity, 97112- Neuromuscular re-education, 97535- Self Care, 40981- Manual therapy, 97760- Orthotic Fit/training, 97014- Electrical stimulation (unattended), [id number]- Electrical stimulation (manual), [id number]- Ultrasound, [id number]- Traction (mechanical), Balance training, Stair training, Taping, Dry Needling, Joint mobilization, Joint manipulation, Spinal manipulation, Spinal mobilization, Cryotherapy, and Moist heat  PLAN FOR NEXT SESSION: HEP for core strengthening, flexion preference no more dry needling for now- had increased pain and numbness after first DN session   Darleene Cleaver, PTA 04/20/23 11:29 AM

## 2023-04-28 ENCOUNTER — Encounter: Payer: Self-pay | Admitting: Physical Therapy

## 2023-04-28 ENCOUNTER — Ambulatory Visit: Payer: Medicaid Other | Attending: Diagnostic Neuroimaging | Admitting: Physical Therapy

## 2023-04-28 DIAGNOSIS — M6281 Muscle weakness (generalized): Secondary | ICD-10-CM | POA: Diagnosis present

## 2023-04-28 DIAGNOSIS — M5442 Lumbago with sciatica, left side: Secondary | ICD-10-CM | POA: Diagnosis present

## 2023-04-28 DIAGNOSIS — R252 Cramp and spasm: Secondary | ICD-10-CM | POA: Diagnosis present

## 2023-04-28 DIAGNOSIS — G8929 Other chronic pain: Secondary | ICD-10-CM | POA: Insufficient documentation

## 2023-04-28 NOTE — Therapy (Addendum)
OUTPATIENT PHYSICAL THERAPY TREATMENT/Discharge summary   Patient Name: Anna Saunders MRN: [id number] DOB:08-Aug-1975, 47 y.o., female Today's Date: 04/28/2023  END OF SESSION:  PT End of Session - 04/28/23 1024     Visit Number 7    Date for PT Re-Evaluation 05/12/23    Authorization Type [place] Medicaid $4    Authorization Time Period 5 visits from 03/17/23-05/15/23; 2 additional visits approved to 05/19/23    Authorization - Visit Number 6    Authorization - Number of Visits 7    PT Start Time 1025    PT Stop Time 1103    PT Time Calculation (min) 38 min    Activity Tolerance Patient tolerated treatment well    Behavior During Therapy WFL for tasks assessed/performed                Past Medical History:  Diagnosis Date   Heart palpitations    Past Surgical History:  Procedure Laterality Date   CESAREAN SECTION     There are no problems to display for this patient.   PCP: Suanne Marker, MD   REFERRING PROVIDER: [facility]   REFERRING DIAG: M54.42,M54.41,G89.29 (ICD-10-CM) - Chronic bilateral low back pain with bilateral sciatica   Rationale for Evaluation and Treatment: Rehabilitation  THERAPY DIAG:  Chronic left-sided low back pain with left-sided sciatica  Muscle weakness (generalized)  Cramp and spasm  ONSET DATE: 3 years ago  SUBJECTIVE:                                                                                                                                                                                           SUBJECTIVE STATEMENT: Back is doing well.  Just a little pain over the last week.    PERTINENT HISTORY:  C-section x 3, pre-diabetes, obesity, chronic knee pain bil  PAIN:  Are you having pain? Yes: NPRS scale: 0/10 Pain location: L side of low back Pain description: poking, stabbing Aggravating factors: standing and cleaning all day  Relieving factors: pain medication - ibuprofen  PRECAUTIONS:  None  RED FLAGS: None   WEIGHT BEARING RESTRICTIONS: No  FALLS:  Has patient fallen in last 6 months? No  LIVING ENVIRONMENT: Lives with: lives with their family Lives in: House/apartment Stairs: Yes: Internal: 14 steps; on left going up and External: 2 steps; none Has following equipment at home: None  OCCUPATION: homemaker  PLOF: Independent  PATIENT GOALS: get rid of pain  NEXT MD VISIT: not scheduled yet  OBJECTIVE:   DIAGNOSTIC FINDINGS:  03/16/23 XR Spine Lumbar IMPRESSION: Mild degenerative disc disease L4-5. Slight lumbar curvature, convexity to the  left. No acute abnormality.   PATIENT SURVEYS:  Modified Oswestry 13/50 = 26% moderate disability   COGNITION: Overall cognitive status: Within functional limits for tasks assessed     SENSATION: Light touch: WFL  MUSCLE LENGTH: Hamstrings: Right 90 deg; Left 90 deg Quadriceps: Moderated tightness bil, measured in prone, increased LBP and burning with knee bend  POSTURE: rounded shoulders  PALPATION: Tenderness with PA mobs L3-5, L erector spinae, QL, L SIJ, L glute med and piriformis.   LUMBAR ROM:   03/17/23- all movements provoked pain on Left low back with increased radiation to LLE, but worse with extension, L SB and L rotation.   AROM eval  Flexion To ankles, pain L low back  Extension Limited 50%, pain L low back  Right lateral flexion To knee, pain  Left lateral flexion To knee, p!  Right rotation WNL, pain  Left rotation WNL, p!   (Blank rows = not tested)    LOWER EXTREMITY ROM:   tightness bil hips but symmetric and WFL   LOWER EXTREMITY MMT:    MMT Left Eval (seated) Right Eval (Seated)  Hip flexion 4+p! 5  Hip extension 4 prone 4 prone  Hip abduction 5 5  Hip adduction 4+ 4+  Knee flexion 5 5  Knee extension 5 5  Ankle dorsiflexion 5 WB 5 WB  Ankle plantarflexion 5 WB 5 WB   (Blank rows = not tested)  LUMBAR SPECIAL TESTS:  Straight leg raise test: Negative, FABER test:  Negative, and Long sit test: Negative  FUNCTIONAL TESTS:  5 times sit to stand: 17 seconds, no UE assist, table height raised to put legs in 90/90 position to accommodate height.   GAIT: Distance walked: 8' Assistive device utilized: None Level of assistance: Complete Independence Comments: no significant deviation  TODAY'S TREATMENT:                                                                                                                              DATE:   04/28/23 Therapeutic Exercise: to improve strength and mobility.  Demo, verbal and tactile cues throughout for technique. Bike L2 x 5 min Seated flexion with ball On ball - hip circles, pelvic tilts, marching Supine LTR 3 x 30 sec hold Prone leg extension x 10 bil  Cat cows in quadruped - noted tightness lumbar spine Prayer stretch , stretch with SB  HEP update  04/20/23 Therapeutic Exercise: to improve strength and mobility.  Demo, verbal and tactile cues throughout for technique. Bike L5x78min Seated L sciatic nerve glide x 10  Good mornings with wand behind back x 5 Seated lumbar flexion stretch with green pball 3x30" SKTC stretch 2x30" BLE LTR 5x10" both sides Supine posterior pelvic tilt 2x10 with 3 sec hold March + TrA GTB x 10 B Clamshells GTB x 10 B Standing marching x 10 B  04/14/23 Therapeutic Exercise: to improve strength and mobility.  Demo, verbal and tactile cues throughout  for technique. Bike L2x47min Standing heel raises 2x10 Standing hip abduction x 10 BLE Standing hip extension x 10 BLE Seated shoulder flexion yellow wt ball x 10; blue x 10  Seated chest press blue wt ball 2x10 Seated side to side with blue wt ball 2 x 10  Seated lumbar flexion stretch 3x30 green pball  Manual Therapy: to decrease muscle spasm and pain and improve mobility IASTM with foam roller to L glutes and QL, STM/TPR to L QL   PATIENT EDUCATION:  Education details: TrDN, HEP Person educated: Patient Education  method: Explanation, Demonstration, Verbal cues, and Handouts Education comprehension: verbalized understanding and returned demonstration  HOME EXERCISE PROGRAM: Access Code: [email or web address] URL: [email or web address] Date: 04/28/2023 Prepared by: Harrie Foreman  Exercises - Supine Posterior Pelvic Tilt with Knee Rocks  - 1 x daily - 7 x weekly - 3 sets - 10 reps - Supine Posterior Pelvic Tilt  - 1 x daily - 7 x weekly - 1 sets - 10 reps -   hold - Posterior Pelvic Tilt with Adduction Using Pillow in Hooklying  - 1 x daily - 7 x weekly - 3 sets - 10 reps - 5- 10 sec  hold - Hooklying Single Knee to Chest Stretch  - 1 x daily - 7 x weekly - 1 sets - 5 reps - 15 sec  hold - Supine Sciatic Nerve Glide  - 1 x daily - 7 x weekly - 1 sets - 10 reps - Supine Piriformis Stretch with Foot on Ground  - 1 x daily - 7 x weekly - 1 sets - 3 reps - 30 sec hold - Clamshell with Resistance  - 1 x daily - 7 x weekly - 2 sets - 10 reps - Supine March with Resistance Band  - 1 x daily - 7 x weekly - 2 sets - 10 reps - Child's Pose Stretch  - 1 x daily - 7 x weekly - 3 sets - 10 reps - Child's Pose with Sidebending  - 1 x daily - 7 x weekly - 3 sets - 10 reps - Cat Cow  - 1 x daily - 7 x weekly - 3 sets - 10 reps - Seated Flexion Stretch with Swiss Ball  - 1 x daily - 7 x weekly - 3 sets - 10 reps - Pelvic Circles on Swiss Ball  - 1 x daily - 7 x weekly - 3 sets - 10 reps - Swiss Ball March  - 1 x daily - 7 x weekly - 3 sets - 10 reps  ASSESSMENT:  CLINICAL IMPRESSION: Anna Saunders is making good progress reporting minimal low back pain now and resolution of radicular pain.  She reports good compliance with exercises.  Today reviewed exercises, since she enjoys swiss ball flexion stretch, also incorporated more swiss ball exercises to encourage more lumbopelvic mobility as she is very tight in low back, also added cat cows to HEP.  She requested 30 day hold today as feels she is independent  with her HEP.     OBJECTIVE IMPAIRMENTS: decreased activity tolerance, decreased endurance, difficulty walking, decreased ROM, decreased strength, hypomobility, increased fascial restrictions, increased muscle spasms, impaired flexibility, and pain.   ACTIVITY LIMITATIONS: carrying, lifting, bending, standing, sleeping, locomotion level, and caring for others  PARTICIPATION LIMITATIONS: meal prep, cleaning, laundry, occupation, and yard work  PERSONAL FACTORS: Time since onset of injury/illness/exacerbation and 1-2 comorbidities: history of C-section  are also affecting patient's functional outcome.   REHAB POTENTIAL:  Good  CLINICAL DECISION MAKING: Stable/uncomplicated  EVALUATION COMPLEXITY: Low   GOALS: Goals reviewed with patient? Yes  SHORT TERM GOALS: Target date: 03/31/2023   Patient will be independent with initial HEP.  Baseline: needs Goal status: MET 03/24/23 given 04/07/23- met  2.  Complete FOTO within 2 visits Baseline:  Goal status: NOT MET    LONG TERM GOALS: Target date: 05/12/2023   Patient will be independent with advanced/ongoing HEP to improve outcomes and carryover.  Baseline:  Goal status: MET 04/28/23 - met  2.  Patient will report 75% improvement in low back pain to improve QOL.  Baseline:  Goal status: MET 04/28/23- met  3.  Patient will demonstrate full pain free lumbar ROM to perform ADLs.   Baseline: see objective Goal status: IN PROGRESS  4. Patient will report at least 6 points improvement on modified Oswestry to demonstrate improved functional ability.  Baseline: 13/50 Goal status: IN PROGRESS   5.   Patient will report centralization of radicular symptoms.  Baseline: radiates to L foot Goal status: MET- 04/20/23- pt reports radicular pain that comes and goes 04/28/23- radicular pain has resolved.   6. Patient will report expected outcome on FOTO Baseline: TBD Goal status: NOT MET 04/28/23- not done   PLAN:  PT FREQUENCY:  1-2x/week  PT DURATION: 8 weeks  PLANNED INTERVENTIONS: 97110-Therapeutic exercises, 97530- Therapeutic activity, 97112- Neuromuscular re-education, 97535- Self Care, 16109- Manual therapy, 97760- Orthotic Fit/training, 97014- Electrical stimulation (unattended), [phone]- Electrical stimulation (manual), 97035- Ultrasound, 09811- Traction (mechanical), Balance training, Stair training, Taping, Dry Needling, Joint mobilization, Joint manipulation, Spinal manipulation, Spinal mobilization, Cryotherapy, and Moist heat  PLAN FOR NEXT SESSION: 30 day hold  Jena Gauss, PT 04/28/23 1:49 PM  PHYSICAL THERAPY DISCHARGE SUMMARY  Visits from Start of Care: 7  Current functional level related to goals / functional outcomes: Decreased pain, centralized symptoms, met LTG 1, 2 & 3   Remaining deficits: Mild low back pain/tightness   Education / Equipment: HEP  Plan: Patient agrees to discharge. Refer to above clinical impression and goal assessment for status as of last visit on 04/28/2023. Patient was placed on hold for 30 days and has not needed to return to PT, therefore will proceed with discharge from PT for this episode.     Jena Gauss, PT  06/15/2023 3:38 PM
